# Patient Record
Sex: Female | Born: 1960 | Race: White | Hispanic: No | Marital: Single | Smoking: Never smoker
Health system: Southern US, Community
[De-identification: ages and names within clinical notes are randomized; demographics above are authoritative.]

## PROBLEM LIST (undated history)

## (undated) DIAGNOSIS — F329 Major depressive disorder, single episode, unspecified: Secondary | ICD-10-CM

## (undated) DIAGNOSIS — F32A Depression, unspecified: Secondary | ICD-10-CM

## (undated) DIAGNOSIS — J189 Pneumonia, unspecified organism: Secondary | ICD-10-CM

## (undated) DIAGNOSIS — K219 Gastro-esophageal reflux disease without esophagitis: Secondary | ICD-10-CM

## (undated) HISTORY — PX: MOUTH SURGERY: SHX715

---

## 2003-07-27 ENCOUNTER — Encounter: Admission: RE | Admit: 2003-07-27 | Discharge: 2003-07-27 | Payer: Self-pay | Admitting: Family Medicine

## 2003-11-17 ENCOUNTER — Other Ambulatory Visit: Admission: RE | Admit: 2003-11-17 | Discharge: 2003-11-17 | Payer: Self-pay | Admitting: Family Medicine

## 2004-11-20 ENCOUNTER — Encounter: Admission: RE | Admit: 2004-11-20 | Discharge: 2004-11-20 | Payer: Self-pay | Admitting: Family Medicine

## 2004-12-27 ENCOUNTER — Other Ambulatory Visit: Admission: RE | Admit: 2004-12-27 | Discharge: 2004-12-27 | Payer: Self-pay | Admitting: Family Medicine

## 2005-12-20 ENCOUNTER — Encounter: Admission: RE | Admit: 2005-12-20 | Discharge: 2005-12-20 | Payer: Self-pay | Admitting: Family Medicine

## 2006-10-17 ENCOUNTER — Other Ambulatory Visit: Admission: RE | Admit: 2006-10-17 | Discharge: 2006-10-17 | Payer: Self-pay | Admitting: Family Medicine

## 2006-12-24 ENCOUNTER — Encounter: Admission: RE | Admit: 2006-12-24 | Discharge: 2006-12-24 | Payer: Self-pay | Admitting: Family Medicine

## 2007-12-03 ENCOUNTER — Other Ambulatory Visit: Admission: RE | Admit: 2007-12-03 | Discharge: 2007-12-03 | Payer: Self-pay | Admitting: Family Medicine

## 2007-12-30 ENCOUNTER — Encounter: Admission: RE | Admit: 2007-12-30 | Discharge: 2007-12-30 | Payer: Self-pay | Admitting: Family Medicine

## 2009-01-11 ENCOUNTER — Encounter: Admission: RE | Admit: 2009-01-11 | Discharge: 2009-01-11 | Payer: Self-pay | Admitting: Family Medicine

## 2009-12-22 ENCOUNTER — Encounter
Admission: RE | Admit: 2009-12-22 | Discharge: 2009-12-22 | Payer: Self-pay | Source: Home / Self Care | Admitting: Family Medicine

## 2010-01-12 ENCOUNTER — Encounter
Admission: RE | Admit: 2010-01-12 | Discharge: 2010-01-12 | Payer: Self-pay | Source: Home / Self Care | Attending: Family Medicine | Admitting: Family Medicine

## 2011-01-04 ENCOUNTER — Other Ambulatory Visit: Payer: Self-pay | Admitting: Family Medicine

## 2011-01-04 ENCOUNTER — Ambulatory Visit
Admission: RE | Admit: 2011-01-04 | Discharge: 2011-01-04 | Disposition: A | Discharge status: Home / Self Care | Payer: BC Managed Care – PPO | Source: Ambulatory Visit | Attending: Family Medicine | Admitting: Family Medicine

## 2011-01-04 DIAGNOSIS — R0602 Shortness of breath: Secondary | ICD-10-CM

## 2011-01-04 DIAGNOSIS — R059 Cough, unspecified: Secondary | ICD-10-CM

## 2011-01-04 DIAGNOSIS — R509 Fever, unspecified: Secondary | ICD-10-CM

## 2011-01-04 DIAGNOSIS — R05 Cough: Secondary | ICD-10-CM

## 2011-01-15 ENCOUNTER — Other Ambulatory Visit: Payer: Self-pay | Admitting: Family Medicine

## 2011-01-15 ENCOUNTER — Emergency Department (HOSPITAL_COMMUNITY): Payer: BC Managed Care – PPO

## 2011-01-15 ENCOUNTER — Inpatient Hospital Stay (HOSPITAL_COMMUNITY)
Admission: EM | Admit: 2011-01-15 | Discharge: 2011-01-18 | DRG: 089 | Disposition: A | Discharge status: Home / Self Care | Payer: BC Managed Care – PPO | Attending: Internal Medicine | Admitting: Internal Medicine

## 2011-01-15 DIAGNOSIS — R509 Fever, unspecified: Secondary | ICD-10-CM | POA: Diagnosis present

## 2011-01-15 DIAGNOSIS — R0902 Hypoxemia: Secondary | ICD-10-CM | POA: Diagnosis present

## 2011-01-15 DIAGNOSIS — D72829 Elevated white blood cell count, unspecified: Secondary | ICD-10-CM | POA: Diagnosis present

## 2011-01-15 DIAGNOSIS — F3289 Other specified depressive episodes: Secondary | ICD-10-CM | POA: Diagnosis present

## 2011-01-15 DIAGNOSIS — J189 Pneumonia, unspecified organism: Principal | ICD-10-CM | POA: Diagnosis present

## 2011-01-15 DIAGNOSIS — F329 Major depressive disorder, single episode, unspecified: Secondary | ICD-10-CM | POA: Diagnosis present

## 2011-01-15 DIAGNOSIS — E876 Hypokalemia: Secondary | ICD-10-CM | POA: Diagnosis present

## 2011-01-15 DIAGNOSIS — R197 Diarrhea, unspecified: Secondary | ICD-10-CM | POA: Diagnosis present

## 2011-01-15 HISTORY — DX: Depression, unspecified: F32.A

## 2011-01-15 HISTORY — DX: Major depressive disorder, single episode, unspecified: F32.9

## 2011-01-15 HISTORY — DX: Pneumonia, unspecified organism: J18.9

## 2011-01-15 HISTORY — DX: Gastro-esophageal reflux disease without esophagitis: K21.9

## 2011-01-15 LAB — URINALYSIS, ROUTINE W REFLEX MICROSCOPIC
Bilirubin Urine: NEGATIVE
Glucose, UA: NEGATIVE mg/dL
Hgb urine dipstick: NEGATIVE
Ketones, ur: NEGATIVE mg/dL
Leukocytes, UA: NEGATIVE
Nitrite: NEGATIVE
Specific Gravity, Urine: 1.016 (ref 1.005–1.030)
Urobilinogen, UA: 0.2 mg/dL (ref 0.0–1.0)
pH: 7.5 (ref 5.0–8.0)

## 2011-01-15 LAB — CBC
HCT: 39.2 % (ref 36.0–46.0)
Hemoglobin: 13.4 g/dL (ref 12.0–15.0)
MCH: 28.8 pg (ref 26.0–34.0)
MCHC: 34.2 g/dL (ref 30.0–36.0)
MCV: 84.3 fL (ref 78.0–100.0)
Platelets: 341 10*3/uL (ref 150–400)
RBC: 4.65 MIL/uL (ref 3.87–5.11)
RDW: 13 % (ref 11.5–15.5)

## 2011-01-15 LAB — COMPREHENSIVE METABOLIC PANEL
ALT: 36 U/L — ABNORMAL HIGH (ref 0–35)
AST: 31 U/L (ref 0–37)
Albumin: 3.8 g/dL (ref 3.5–5.2)
BUN: 8 mg/dL (ref 6–23)
CO2: 27 mEq/L (ref 19–32)
Calcium: 9.7 mg/dL (ref 8.4–10.5)
Chloride: 99 mEq/L (ref 96–112)
Creatinine, Ser: 0.6 mg/dL (ref 0.50–1.10)
GFR calc Af Amer: >90 mL/min (ref >90)
GFR calc non Af Amer: >90 mL/min (ref >90)
Glucose, Bld: 82 mg/dL (ref 70–99)
Sodium: 138 mEq/L (ref 135–145)
Total Bilirubin: 0.5 mg/dL (ref 0.3–1.2)
Total Protein: 8 g/dL (ref 6.0–8.3)

## 2011-01-15 LAB — LACTIC ACID, PLASMA: Lactic Acid, Venous: 1.1 mmol/L (ref 0.5–2.2)

## 2011-01-15 MED ORDER — LEVOFLOXACIN IN D5W 750 MG/150ML IV SOLN
750.0000 mg | INTRAVENOUS | Status: DC
  Administered 2011-01-15 – 2011-01-17 (×3): 750 mg via INTRAVENOUS
  Filled 2011-01-15 (×4): qty 150

## 2011-01-15 MED ORDER — IOHEXOL 300 MG/ML  SOLN
100.0000 mL | Freq: Once | INTRAMUSCULAR | Status: AC | PRN
  Administered 2011-01-15: 80 mL via INTRAVENOUS

## 2011-01-15 NOTE — ED Notes (Signed)
Dx Pneumonia by reg dr. Being treated as an outpt.Sent here from Dr. Kevan Ny office for admit.

## 2011-01-15 NOTE — ED Notes (Signed)
Pt refused ABG

## 2011-01-15 NOTE — ED Notes (Signed)
Per EMS- pt reports dx with pneumonia christmas eve- given zpack and rocephin- pt started to improve went to PCP for follow up decreased oxygen sats 88%RA application of 2lnc increased 02 2lnc at present- pt denies SOB and CP at present

## 2011-01-15 NOTE — H&P (Signed)
PCP:   Hollice Espy, MD, MD   Chief Complaint:  Cough  HPI: This is a 50 year old female who states she's been ill since December 15. She saw her PCP on the 18th, her flu swab was negative, she was treated with a Z-Pak. She was given a prescription and told her she did not feel any better she should do chest x-ray in 2 days. She did a chest x-ray was diagnosed with pneumonia, given a shot of IV Rocephin. She had a routine followup today, she told her physician she was still not getting any better, she still had a productive cough brown sputum. She had been febrile up to 103.8, but afebrile in the past few days. She vomited 3 times, no hematemesis. Last episode was Friday. She   she has had ongoing diarrhea, no abdominal pains. No mental staus changes or burning on urination .she reports a sore throat, headache and pleuritic chest pain with her cough.her O2 sat was checked she is 85% in the office.she was sent to the ER.history obtained from patient    Review of Systems:  positives bolded  The patient denies anorexia, fever, weight loss,, vision loss, decreased hearing, hoarseness, chest pain, syncope, dyspnea on exertion, peripheral edema, balance deficits, hemoptysis, abdominal pain, melena, hematochezia, severe indigestion/heartburn, hematuria, incontinence, genital sores, muscle weakness, suspicious skin lesions, transient blindness, difficulty walking, depression, unusual weight change, abnormal bleeding, enlarged lymph nodes, angioedema, and breast masses.  Past Medical History: Past Medical History  Diagnosis Date  . Pneumonia   . Depression   . GERD (gastroesophageal reflux disease)    Past Surgical History  Procedure Date  . Mouth surgery     Medications: Prior to Admission medications   Medication Sig Start Date End Date Taking? Authorizing Provider  ibuprofen (ADVIL,MOTRIN) 200 MG tablet Take 200 mg by mouth every 6 (six) hours as needed.     Yes Historical Provider, MD    sertraline (ZOLOFT) 100 MG tablet Take 100 mg by mouth daily.     Yes Historical Provider, MD    Allergies:   Allergies  Allergen Reactions  . Thimerosal     rash    Social History:  reports that she has never smoked. She has never used smokeless tobacco. She reports that she drinks alcohol. She reports that she does not use illicit drugs.  Family History: Family History  Problem Relation Age of Onset  . Breast cancer      Physical Exam: Filed Vitals:   01/15/11 1522 01/15/11 1531 01/15/11 1534 01/15/11 1957  BP:  117/64  111/59  Pulse:  72  91  Temp:  98.9 F (37.2 C)    TempSrc:  Oral    Resp:  20  22  Weight:  88.451 kg (195 lb)    SpO2: 94% 94% 94% 93%    General:  Alert and oriented times three, well developed and nourished, no acute distress Eyes: PERRLA, pink conjunctiva, no scleral icterus ENT: Moist oral mucosa, neck supple, no thyromegaly Lungs: clear to ascultation, no wheeze, no crackles, no use of accessory muscles Cardiovascular: regular rate and rhythm, no regurgitation, no gallops, no murmurs. No carotid bruits, no JVD Abdomen: soft, positive BS, non-tender, non-distended, no organomegaly, not an acute abdomen GU: not examined Neuro: CN II - XII grossly intact, sensation intact Musculoskeletal: strength 5/5 all extremities, no clubbing, cyanosis or edema Skin: no rash, no subcutaneous crepitation, no decubitus Psych: appropriate patient   Labs on Admission:   [facility]  01/15/11 1618  NA 138  K 3.8  CL 99  CO2 27  GLUCOSE 82  BUN 8  CREATININE 0.60  CALCIUM 9.7  MG --  PHOS --    Basename 01/15/11 1618  AST 31  ALT 36*  ALKPHOS 94  BILITOT 0.5  PROT 8.0  ALBUMIN 3.8   No results found for this basename: LIPASE:2,AMYLASE:2 in the last 72 hours  Basename 01/15/11 1618  WBC 10.7*  NEUTROABS --  HGB 13.4  HCT 39.2  MCV 84.3  PLT 341   No results found for this basename: CKTOTAL:3,CKMB:3,CKMBINDEX:3,TROPONINI:3 in the last 72  hours No results found for this basename: TSH,T4TOTAL,FREET3,T3FREE,THYROIDAB in the last 72 hours No results found for this basename: VITAMINB12:2,FOLATE:2,FERRITIN:2,TIBC:2,IRON:2,RETICCTPCT:2 in the last 72 hours  Radiological Exams on Admission: Dg Chest 2 View  01/15/2011  *RADIOLOGY REPORT*  Clinical Data: 2-week history of cough, shortness of breath, chest pain, and chest congestion.  Follow up pneumonia.  CHEST - 2 VIEW 01/15/2011:  Comparison: Two-view chest x-ray 01/04/2011 [facility].  Findings: Interval progression of patchy airspace opacities in the right middle lobe and right lower lobe.  Right upper lobe and the entire left lung remain clear.  Cardiomediastinal silhouette unremarkable and unchanged.  No pleural effusions.  Visualized bony thorax intact.  IMPRESSION: Right middle lobe and right lower lobe pneumonia, progressive since 01/04/2011.  Original Report Authenticated By: Arnell Sieving, M.D.   Ct Angio Chest W/cm &/or Wo Cm  01/15/2011  *RADIOLOGY REPORT*  Clinical Data:  Shortness of breath, hypoxia  CT ANGIOGRAPHY CHEST WITH CONTRAST  Technique:  Multidetector CT imaging of the chest was performed using the standard protocol during bolus administration of intravenous contrast.  Multiplanar CT image reconstructions including MIPs were obtained to evaluate the vascular anatomy.  Contrast: 80mL OMNIPAQUE IOHEXOL 300 MG/ML IV SOLN  Comparison:  Chest radiographs dated 01/15/2011  Findings:  No evidence of pulmonary embolism.  Numerous nodular/tree-in-bud opacities, predominantly in the right middle and lower lobes.  Trace right pleural effusion.  No pneumothorax.  Visualized thyroid is unremarkable.  The heart is normal in size.  No pericardial effusion.  Right hilar/mediastinal lymphadenopathy, including a 1.4 cm short- axis right hilar node and a 1.3 short-axis subcarinal node.  Visualized upper abdomen is notable for hepatic steatosis.  Mild degenerative changes of the  visualized thoracolumbar spine.  Review of the MIP images confirms the above findings.  IMPRESSION: Numerous right middle/lower lobe tree-in-bud nodular opacities, likely infectious. Associated trace right pleural effusion. Atypical/viral etiologies should also be considered.  Right hilar/mediastinal lymphadenopathy, likely reactive.  Hepatic steatosis.  Original Report Authenticated By: Charline Bills, M.D.    Assessment/Plan Present on Admission:  .Pneumonia .Hypoxia  admit to MedSurg Oxygen, antibiotics, sputum cultures and nebulizers ordered Blood cultures ordered Diarrhea C. difficile toxins Antidiarrheals if negative  Full code  DVT prophylaxis Team 2  Amreen Raczkowski 01/15/2011, 8:34 PM

## 2011-01-15 NOTE — ED Provider Notes (Signed)
History     CSN: [id number]  Arrival date & time 01/15/11  1515   First MD Initiated Contact with Patient 01/15/11 1534      Chief Complaint  Patient presents with  . Shortness of Breath  . Pneumonia  . Fatigue    (Consider location/radiation/quality/duration/timing/severity/associated sxs/prior treatment) Patient is a 50 y.o. female presenting with shortness of breath. The history is provided by the patient.  Shortness of Breath  The current episode started more than 1 week ago. The onset was gradual. The problem occurs continuously. The problem has been gradually worsening. The problem is moderate. The symptoms are relieved by nothing. The symptoms are aggravated by activity. Associated symptoms include a fever and cough. Pertinent negatives include no chest pain, no chest pressure, no orthopnea, no sore throat and no shortness of breath. She has had no prior intubations. Her past medical history does not include asthma. Urine output has been normal. Recently, medical care has been given by the PCP. Services received include medications given and tests performed.   the patient was seen by her primary Dr. on December 18 and was diagnosed with pneumonia. She was given a shot of Rocephin and a Z-Pak. Her pneumonia was confirmed on a chest x-ray. She went to her primary Dr. today for followup she has been feeling gradually worse over the last 3 days with fatigue, cough, chills. Her oxygen saturation was found to be 88% on room air and she was sent to the emergency department for further evaluation.  Past Medical History  Diagnosis Date  . Pneumonia   . Depression   . GERD (gastroesophageal reflux disease)     History reviewed. No pertinent past surgical history.  No family history on file.  History  Substance Use Topics  . Smoking status: Never Smoker   . Smokeless tobacco: Not on file  . Alcohol Use: Yes     Review of Systems  Constitutional: Positive for fever, chills and  fatigue.  HENT: Positive for congestion. Negative for ear pain, sore throat, neck pain and neck stiffness.   Eyes: Negative for pain and visual disturbance.  Respiratory: Positive for cough. Negative for chest tightness and shortness of breath.   Cardiovascular: Negative for chest pain, orthopnea and leg swelling.  Gastrointestinal: Positive for nausea. Negative for abdominal pain and abdominal distention.       2 episodes of vomiting without hematemesis in last week, most recently Friday. Loose stools since initiation of abx therapy  Genitourinary: Negative for dysuria, hematuria and flank pain.  Musculoskeletal: Negative for joint swelling and gait problem.       Aching pain to right mid-back without injury, worse with twisting or coughing  Skin: Negative for rash and wound.  Neurological: Negative for dizziness, syncope, weakness and headaches.  Hematological: Does not bruise/bleed easily.  Psychiatric/Behavioral: Negative for confusion.    Allergies  Thimerosal  Home Medications   Current Outpatient Rx  Name Route Sig Dispense Refill  . IBUPROFEN 200 MG PO TABS Oral Take 200 mg by mouth every 6 (six) hours as needed.      . SERTRALINE HCL 100 MG PO TABS Oral Take 100 mg by mouth daily.        BP 117/64  Pulse 72  Temp(Src) 98.9 F (37.2 C) (Oral)  Resp 20  Wt 195 lb (88.451 kg)  SpO2 94%  Physical Exam  Nursing note and vitals reviewed. Constitutional: She is oriented to person, place, and time. She appears well-developed and well-nourished.  No distress.  HENT:  Head: Normocephalic and atraumatic.  Right Ear: External ear normal.  Left Ear: External ear normal.  Nose: Nose normal.  Mouth/Throat: Oropharynx is clear and moist. No oropharyngeal exudate.  Eyes: Conjunctivae and EOM are normal. Pupils are equal, round, and reactive to light. Right eye exhibits no discharge. Left eye exhibits no discharge.  Neck: Normal range of motion. Neck supple.  Cardiovascular:  Normal rate, regular rhythm, normal heart sounds and intact distal pulses.   No murmur heard. Pulmonary/Chest: Effort normal. Not tachypneic. No respiratory distress. She has decreased breath sounds in the right lower field. She has wheezes in the right upper field. She has rales in the right lower field. She exhibits no tenderness.  Abdominal: Soft. Bowel sounds are normal. She exhibits no distension. There is no tenderness.  Musculoskeletal: She exhibits no edema and no tenderness.  Lymphadenopathy:    She has no cervical adenopathy.  Neurological: She is alert and oriented to person, place, and time. No cranial nerve deficit. Coordination normal.  Skin: Skin is warm and dry. No rash noted. She is not diaphoretic.  Psychiatric: Her behavior is normal.    ED Course  Procedures (including critical care time)  Labs Reviewed  COMPREHENSIVE METABOLIC PANEL - Abnormal; Notable for the following:    ALT 36 (*)    All other components within normal limits  CBC - Abnormal; Notable for the following:    WBC 10.7 (*)    All other components within normal limits  URINALYSIS, ROUTINE W REFLEX MICROSCOPIC  LACTIC ACID, PLASMA  CULTURE, BLOOD (ROUTINE X 2)  CULTURE, BLOOD (ROUTINE X 2)  BLOOD GAS, ARTERIAL   Dg Chest 2 View  01/15/2011  *RADIOLOGY REPORT*  Clinical Data: 2-week history of cough, shortness of breath, chest pain, and chest congestion.  Follow up pneumonia.  CHEST - 2 VIEW 01/15/2011:  Comparison: Two-view chest x-ray 01/04/2011 [facility].  Findings: Interval progression of patchy airspace opacities in the right middle lobe and right lower lobe.  Right upper lobe and the entire left lung remain clear.  Cardiomediastinal silhouette unremarkable and unchanged.  No pleural effusions.  Visualized bony thorax intact.  IMPRESSION: Right middle lobe and right lower lobe pneumonia, progressive since 01/04/2011.  Original Report Authenticated By: Arnell Sieving, M.D.   Ct Angio  Chest W/cm &/or Wo Cm  01/15/2011  *RADIOLOGY REPORT*  Clinical Data:  Shortness of breath, hypoxia  CT ANGIOGRAPHY CHEST WITH CONTRAST  Technique:  Multidetector CT imaging of the chest was performed using the standard protocol during bolus administration of intravenous contrast.  Multiplanar CT image reconstructions including MIPs were obtained to evaluate the vascular anatomy.  Contrast: 80mL OMNIPAQUE IOHEXOL 300 MG/ML IV SOLN  Comparison:  Chest radiographs dated 01/15/2011  Findings:  No evidence of pulmonary embolism.  Numerous nodular/tree-in-bud opacities, predominantly in the right middle and lower lobes.  Trace right pleural effusion.  No pneumothorax.  Visualized thyroid is unremarkable.  The heart is normal in size.  No pericardial effusion.  Right hilar/mediastinal lymphadenopathy, including a 1.4 cm short- axis right hilar node and a 1.3 short-axis subcarinal node.  Visualized upper abdomen is notable for hepatic steatosis.  Mild degenerative changes of the visualized thoracolumbar spine.  Review of the MIP images confirms the above findings.  IMPRESSION: Numerous right middle/lower lobe tree-in-bud nodular opacities, likely infectious. Associated trace right pleural effusion. Atypical/viral etiologies should also be considered.  Right hilar/mediastinal lymphadenopathy, likely reactive.  Hepatic steatosis.  Original Report Authenticated  By: Charline Bills, M.D.    Dx 1: Community acquired pneumonia   MDM  Pneumonia, failed outpatient tx with new hypoxia, other VSS. Pt to be admitted for further management. [facility] consulted, request CT angio chest and ABG for further evaluation. Bed request placed for stepdown. Levaquin ordered.        Shaaron Adler, [place] 01/15/11 [address] [place], [place] 01/15/11 [phone]

## 2011-01-16 LAB — CLOSTRIDIUM DIFFICILE BY PCR: Toxigenic C. Difficile by PCR: NEGATIVE

## 2011-01-16 LAB — CBC
MCH: 28.3 pg (ref 26.0–34.0)
MCV: 85 fL (ref 78.0–100.0)
Platelets: 353 10*3/uL (ref 150–400)
RBC: 4.27 MIL/uL (ref 3.87–5.11)
RDW: 13.1 % (ref 11.5–15.5)
WBC: 8.8 10*3/uL (ref 4.0–10.5)

## 2011-01-16 LAB — BASIC METABOLIC PANEL
CO2: 28 mEq/L (ref 19–32)
Calcium: 9.9 mg/dL (ref 8.4–10.5)
Creatinine, Ser: 0.73 mg/dL (ref 0.50–1.10)
GFR calc non Af Amer: >90 mL/min (ref >90)
Glucose, Bld: 96 mg/dL (ref 70–99)
Sodium: 139 mEq/L (ref 135–145)

## 2011-01-16 MED ORDER — ALUM & MAG HYDROXIDE-SIMETH 200-200-20 MG/5ML PO SUSP: 30.0000 mL | Freq: Four times a day (QID) | ORAL | Status: DC | PRN

## 2011-01-16 MED ORDER — ALBUTEROL SULFATE (5 MG/ML) 0.5% IN NEBU: 2.5000 mg | INHALATION_SOLUTION | RESPIRATORY_TRACT | Status: DC | PRN

## 2011-01-16 MED ORDER — ONDANSETRON HCL 4 MG PO TABS: 4.0000 mg | ORAL_TABLET | Freq: Four times a day (QID) | ORAL | Status: DC | PRN

## 2011-01-16 MED ORDER — SODIUM CHLORIDE 0.9 % IJ SOLN: 3.0000 mL | INTRAMUSCULAR | Status: DC | PRN

## 2011-01-16 MED ORDER — IPRATROPIUM BROMIDE 0.02 % IN SOLN
0.5000 mg | Freq: Three times a day (TID) | RESPIRATORY_TRACT | Status: DC
  Administered 2011-01-16 – 2011-01-18 (×8): 0.5 mg via RESPIRATORY_TRACT
  Filled 2011-01-16 (×9): qty 2.5

## 2011-01-16 MED ORDER — ACETAMINOPHEN 325 MG PO TABS: 650.0000 mg | ORAL_TABLET | Freq: Four times a day (QID) | ORAL | Status: DC | PRN

## 2011-01-16 MED ORDER — ALBUTEROL SULFATE (5 MG/ML) 0.5% IN NEBU
2.5000 mg | INHALATION_SOLUTION | RESPIRATORY_TRACT | Status: DC | PRN
  Administered 2011-01-16: 2.5 mg via RESPIRATORY_TRACT
  Filled 2011-01-16: qty 0.5

## 2011-01-16 MED ORDER — SERTRALINE HCL 100 MG PO TABS
100.0000 mg | ORAL_TABLET | Freq: Every day | ORAL | Status: DC
  Administered 2011-01-16: 50 mg via ORAL
  Administered 2011-01-17: 100 mg via ORAL
  Administered 2011-01-18: 50 mg via ORAL
  Filled 2011-01-16 (×4): qty 1

## 2011-01-16 MED ORDER — IPRATROPIUM BROMIDE 0.02 % IN SOLN: 0.5000 mg | RESPIRATORY_TRACT | Status: DC

## 2011-01-16 MED ORDER — ACETAMINOPHEN 650 MG RE SUPP: 650.0000 mg | Freq: Four times a day (QID) | RECTAL | Status: DC | PRN

## 2011-01-16 MED ORDER — HYDROCODONE-ACETAMINOPHEN 5-325 MG PO TABS: 1.0000 | ORAL_TABLET | ORAL | Status: DC | PRN

## 2011-01-16 MED ORDER — BENZONATATE 100 MG PO CAPS
100.0000 mg | ORAL_CAPSULE | Freq: Three times a day (TID) | ORAL | Status: DC | PRN
  Administered 2011-01-16 – 2011-01-18 (×3): 100 mg via ORAL
  Filled 2011-01-16 (×2): qty 1

## 2011-01-16 MED ORDER — ENOXAPARIN SODIUM 40 MG/0.4ML ~~LOC~~ SOLN
40.0000 mg | SUBCUTANEOUS | Status: DC
  Administered 2011-01-16 – 2011-01-17 (×2): 40 mg via SUBCUTANEOUS
  Filled 2011-01-16 (×4): qty 0.4

## 2011-01-16 MED ORDER — ONDANSETRON HCL 4 MG/2ML IJ SOLN: 4.0000 mg | Freq: Four times a day (QID) | INTRAMUSCULAR | Status: DC | PRN

## 2011-01-16 MED ORDER — IBUPROFEN 200 MG PO TABS
200.0000 mg | ORAL_TABLET | Freq: Four times a day (QID) | ORAL | Status: DC | PRN
  Filled 2011-01-16: qty 1

## 2011-01-16 NOTE — ED Provider Notes (Signed)
Medical screening examination/treatment/procedure(s) were performed by non-physician practitioner and as supervising physician I was immediately available for consultation/collaboration.   Jerrianne Hartin A. Breonia Kirstein, MD 01/16/11 1459 

## 2011-01-16 NOTE — Progress Notes (Signed)
PROGRESS NOTE  Ruthene Methvin ZOX:[id number] DOB: 01-19-1960 DOA: 01/15/2011 PCP: Hollice Espy, MD, MD  Brief narrative: 51 year old woman diagnosed with pneumonia by her primary care physician on Christmas Eve and started on azithromycin and Rocephin. She followed up with her primary care physician December 31 with complaint of feeling worse with fatigue, cough and chills. Oxygen saturation 98% on room air. She was sent to the emergency department for further evaluation. Admitted for pneumonia.  Past medical history: Pneumonia, depression, GERD  Consultants:  None  Procedures:  None  Antibiotics:  December 31: Levaquin  Interim History: Interval documentation reviewed. Subjective: Feels somewhat better today.  Objective: Filed Vitals:   01/16/11 0004 01/16/11 0029 01/16/11 0454 01/16/11 0506  BP: 124/74 108/70  109/74  Pulse:  79  76  Temp:  98.2 F (36.8 C)  98.2 F (36.8 C)  TempSrc:  Oral  Oral  Resp:  18  18  Height:      Weight: 89.5 kg (197 lb 5 oz)     SpO2:  90% 95% 90%    Intake/Output Summary (Last 24 hours) at 01/16/11 1038 Last data filed at 01/16/11 0900  Gross per 24 hour  Intake    360 ml  Output      0 ml  Net    360 ml    Exam:  General: Appears calm and comfortable. Mildly ill. Cardiovascular: Regular rate and rhythm. No murmur, rub or gallop. No lower extremity edema. Respiratory: Decreased breath sounds bilaterally but no frank wheezes, rales or rhonchi. Mild increased work of breathing.  Data Reviewed: Basic Metabolic Panel:  Lab 01/16/11 8119 01/15/11 1618  NA 139 138  K 3.3* 3.8  CL 100 99  CO2 28 27  GLUCOSE 96 82  BUN 8 8  CREATININE 0.73 0.60  CALCIUM 9.9 9.7  MG -- --  PHOS -- --   Liver Function Tests:  Lab 01/15/11 1618  AST 31  ALT 36*  ALKPHOS 94  BILITOT 0.5  PROT 8.0  ALBUMIN 3.8   CBC:  Lab 01/16/11 0515 01/15/11 1618  WBC 8.8 10.7*  NEUTROABS -- --  HGB 12.1 13.4  HCT 36.3 39.2  MCV 85.0  84.3  PLT 353 341   Studies: Dg Chest 2 View  01/15/2011  *RADIOLOGY REPORT*  Clinical Data: 2-week history of cough, shortness of breath, chest pain, and chest congestion.  Follow up pneumonia.  CHEST - 2 VIEW 01/15/2011:  Comparison: Two-view chest x-ray 01/04/2011 [facility].  Findings: Interval progression of patchy airspace opacities in the right middle lobe and right lower lobe.  Right upper lobe and the entire left lung remain clear.  Cardiomediastinal silhouette unremarkable and unchanged.  No pleural effusions.  Visualized bony thorax intact.  IMPRESSION: Right middle lobe and right lower lobe pneumonia, progressive since 01/04/2011.  Original Report Authenticated By: Arnell Sieving, M.D.   Ct Angio Chest W/cm &/or Wo Cm  01/15/2011  *RADIOLOGY REPORT*  Clinical Data:  Shortness of breath, hypoxia  CT ANGIOGRAPHY CHEST WITH CONTRAST  Technique:  Multidetector CT imaging of the chest was performed using the standard protocol during bolus administration of intravenous contrast.  Multiplanar CT image reconstructions including MIPs were obtained to evaluate the vascular anatomy.  Contrast: 80mL OMNIPAQUE IOHEXOL 300 MG/ML IV SOLN  Comparison:  Chest radiographs dated 01/15/2011  Findings:  No evidence of pulmonary embolism.  Numerous nodular/tree-in-bud opacities, predominantly in the right middle and lower lobes.  Trace right pleural effusion.  No pneumothorax.  Visualized thyroid is unremarkable.  The heart is normal in size.  No pericardial effusion.  Right hilar/mediastinal lymphadenopathy, including a 1.4 cm short- axis right hilar node and a 1.3 short-axis subcarinal node.  Visualized upper abdomen is notable for hepatic steatosis.  Mild degenerative changes of the visualized thoracolumbar spine.  Review of the MIP images confirms the above findings.  IMPRESSION: Numerous right middle/lower lobe tree-in-bud nodular opacities, likely infectious. Associated trace right pleural  effusion. Atypical/viral etiologies should also be considered.  Right hilar/mediastinal lymphadenopathy, likely reactive.  Hepatic steatosis.  Original Report Authenticated By: Charline Bills, M.D.    Scheduled Meds:   . enoxaparin  40 mg Subcutaneous Q24H  . ipratropium  0.5 mg Nebulization TID  . levofloxacin (LEVAQUIN) IV  750 mg Intravenous Q24H  . sertraline  100 mg Oral Daily  . DISCONTD: ipratropium  0.5 mg Nebulization Q4H   Continuous Infusions:    Assessment/Plan: 1. Pneumonia: failed outpatient therapy: Some improvement subjectively. Continue Levaquin. 2. Hypokalemia: Replete. 3. Diarrhea: C. difficile negative. History is highly suggestive of adverse reaction to outpatient antibiotics.  Code Status: Full code Family Communication: Spoke with family at bedside Disposition Plan: Home when improved.   Brendia Sacks, MD  [facility] Pager [phone] 01/16/2011, 10:38 AM    LOS: 1 day

## 2011-01-16 NOTE — Progress Notes (Signed)
ANTIBIOTIC CONSULT NOTE - INITIAL  Pharmacy Consult for Levofloxacin Indication: pneumonia  Allergies  Allergen Reactions  . Thimerosal     rash    Patient Measurements: Height: 5\' 6"  (167.6 cm) Weight: 197 lb 5 oz (89.5 kg) IBW/kg (Calculated) : 59.3  Adjusted Body Weight:   Vital Signs: Temp: 98.2 F (36.8 C) (01/01 0029) Temp src: Oral (01/01 0029) BP: 108/70 mmHg (01/01 0029) Pulse Rate: 79  (01/01 0029) Intake/Output from previous day:   Intake/Output from this shift:    Labs:  [facility] 01/15/11 1618  WBC 10.7*  HGB 13.4  PLT 341  LABCREA --  CREATININE 0.60   Estimated Creatinine Clearance: 94.8 ml/min (by C-G formula based on Cr of 0.6). No results found for this basename: VANCOTROUGH:2,VANCOPEAK:2,VANCORANDOM:2,GENTTROUGH:2,GENTPEAK:2,GENTRANDOM:2,TOBRATROUGH:2,TOBRAPEAK:2,TOBRARND:2,AMIKACINPEAK:2,AMIKACINTROU:2,AMIKACIN:2, in the last 72 hours   Microbiology: No results found for this or any previous visit (from the past 720 hour(s)).  Medical History: Past Medical History  Diagnosis Date  . Pneumonia   . Depression   . GERD (gastroesophageal reflux disease)     Medications:  Anti-infectives     Start     Dose/Rate Route Frequency Ordered Stop   01/15/11 1745   Levofloxacin (LEVAQUIN) IVPB 750 mg        750 mg 100 mL/hr over 90 Minutes Intravenous Every 24 hours 01/15/11 1737           Assessment: Patient with PNA and levofloxacin per pharmacy ordered.  Levofloxacin already started at correct dose/frequency.  Goal of Therapy:  Levofloxacin based on renal function   Plan:  Continue with current levofloxacin dose  Darlina Guys, Rennee Coyne [place] 01/16/2011,3:16 AM

## 2011-01-17 MED ORDER — GUAIFENESIN ER 600 MG PO TB12
1200.0000 mg | ORAL_TABLET | Freq: Two times a day (BID) | ORAL | Status: DC
  Administered 2011-01-17 – 2011-01-18 (×3): 1200 mg via ORAL
  Filled 2011-01-17 (×6): qty 2

## 2011-01-17 MED ORDER — POTASSIUM CHLORIDE CRYS ER 20 MEQ PO TBCR
40.0000 meq | EXTENDED_RELEASE_TABLET | Freq: Once | ORAL | Status: AC
  Administered 2011-01-17: 40 meq via ORAL
  Filled 2011-01-17: qty 2

## 2011-01-17 NOTE — Progress Notes (Signed)
UR complete 

## 2011-01-17 NOTE — Progress Notes (Signed)
Effie Wahlert is a 51 y.o. female patient admitted with CAP failing outpatient treatment. I have reviewed her chart,seen and examined her at bedside.  SUBJECTIVE Feels some better but c/o persistent cough.   1. Pneumonia   2. Hypoxia     Past Medical History  Diagnosis Date  . Pneumonia   . Depression   . GERD (gastroesophageal reflux disease)    Current Facility-Administered Medications  Medication Dose Route Frequency Provider Last Rate Last Dose  . acetaminophen (TYLENOL) tablet 650 mg  650 mg Oral Q6H PRN Debby Crosley, MD       Or  . acetaminophen (TYLENOL) suppository 650 mg  650 mg Rectal Q6H PRN Debby Crosley, MD      . ipratropium (ATROVENT) nebulizer solution 0.5 mg  0.5 mg Nebulization TID Reina Fuse Goodrich   0.5 mg at 01/16/11 2157   And  . albuterol (PROVENTIL) (5 MG/ML) 0.5% nebulizer solution 2.5 mg  2.5 mg Nebulization Q4H PRN Pandora Leiter   2.5 mg at 01/16/11 0452  . alum & mag hydroxide-simeth (MAALOX/MYLANTA) 200-200-20 MG/5ML suspension 30 mL  30 mL Oral Q6H PRN Debby Crosley, MD      . benzonatate (TESSALON) capsule 100 mg  100 mg Oral TID PRN Gery Pray, MD   100 mg at 01/16/11 2254  . enoxaparin (LOVENOX) injection 40 mg  40 mg Subcutaneous Q24H Debby Crosley, MD   40 mg at 01/16/11 0955  . guaiFENesin (MUCINEX) 12 hr tablet 1,200 mg  1,200 mg Oral BID Ellajane Stong      . HYDROcodone-acetaminophen (NORCO) 5-325 MG per tablet 1-2 tablet  1-2 tablet Oral Q4H PRN Debby Crosley, MD      . ibuprofen (ADVIL,MOTRIN) tablet 200 mg  200 mg Oral Q6H PRN Debby Crosley, MD      . Levofloxacin (LEVAQUIN) IVPB 750 mg  750 mg Intravenous Q24H Shaaron Adler, PA   750 mg at 01/16/11 1800  . ondansetron (ZOFRAN) tablet 4 mg  4 mg Oral Q6H PRN Debby Crosley, MD       Or  . ondansetron (ZOFRAN) injection 4 mg  4 mg Intravenous Q6H PRN Debby Crosley, MD      . potassium chloride SA (K-DUR,KLOR-CON) CR tablet 40 mEq  40 mEq Oral Once Nazirah Tri        . sertraline (ZOLOFT) tablet 100 mg  100 mg Oral Daily Debby Crosley, MD   50 mg at 01/16/11 0943  . sodium chloride 0.9 % injection 3 mL  3 mL Intravenous PRN Gery Pray, MD       Allergies  Allergen Reactions  . Thimerosal     rash   Principal Problem:  *Hypoxia Active Problems:  Pneumonia   Vital signs in last 24 hours: Temp:  [97.7 F (36.5 C)-98.3 F (36.8 C)] 97.7 F (36.5 C) (01/02 0551) Pulse Rate:  [67-83] 67  (01/02 0551) Resp:  [17-18] 18  (01/02 0551) BP: (103-106)/(60-65) 106/60 mmHg (01/02 0551) SpO2:  [95 %-97 %] 96 % (01/02 0551) Weight change:  Last BM Date: 01/16/11  Intake/Output from previous day: 01/01 0701 - 01/02 0700 In: 1230 [P.O.:1080; IV Piggyback:150] Out: -  Intake/Output this shift:    Lab Results:  Basename 01/16/11 0515 01/15/11 1618  WBC 8.8 10.7*  HGB 12.1 13.4  HCT 36.3 39.2  PLT 353 341   BMET  Basename 01/16/11 0515 01/15/11 1618  NA 139 138  K 3.3* 3.8  CL 100 99  CO2 28 27  GLUCOSE 96 82  BUN 8 8  CREATININE 0.73 0.60  CALCIUM 9.9 9.7    Studies/Results: Dg Chest 2 View  01/15/2011  *RADIOLOGY REPORT*  Clinical Data: 2-week history of cough, shortness of breath, chest pain, and chest congestion.  Follow up pneumonia.  CHEST - 2 VIEW 01/15/2011:  Comparison: Two-view chest x-ray 01/04/2011 [facility].  Findings: Interval progression of patchy airspace opacities in the right middle lobe and right lower lobe.  Right upper lobe and the entire left lung remain clear.  Cardiomediastinal silhouette unremarkable and unchanged.  No pleural effusions.  Visualized bony thorax intact.  IMPRESSION: Right middle lobe and right lower lobe pneumonia, progressive since 01/04/2011.  Original Report Authenticated By: Arnell Sieving, M.D.   Ct Angio Chest W/cm &/or Wo Cm  01/15/2011  *RADIOLOGY REPORT*  Clinical Data:  Shortness of breath, hypoxia  CT ANGIOGRAPHY CHEST WITH CONTRAST  Technique:  Multidetector CT imaging  of the chest was performed using the standard protocol during bolus administration of intravenous contrast.  Multiplanar CT image reconstructions including MIPs were obtained to evaluate the vascular anatomy.  Contrast: 80mL OMNIPAQUE IOHEXOL 300 MG/ML IV SOLN  Comparison:  Chest radiographs dated 01/15/2011  Findings:  No evidence of pulmonary embolism.  Numerous nodular/tree-in-bud opacities, predominantly in the right middle and lower lobes.  Trace right pleural effusion.  No pneumothorax.  Visualized thyroid is unremarkable.  The heart is normal in size.  No pericardial effusion.  Right hilar/mediastinal lymphadenopathy, including a 1.4 cm short- axis right hilar node and a 1.3 short-axis subcarinal node.  Visualized upper abdomen is notable for hepatic steatosis.  Mild degenerative changes of the visualized thoracolumbar spine.  Review of the MIP images confirms the above findings.  IMPRESSION: Numerous right middle/lower lobe tree-in-bud nodular opacities, likely infectious. Associated trace right pleural effusion. Atypical/viral etiologies should also be considered.  Right hilar/mediastinal lymphadenopathy, likely reactive.  Hepatic steatosis.  Original Report Authenticated By: Charline Bills, M.D.    Medications: I have reviewed the patient's current medications.   Physical exam GENERAL- alert HEAD- normal atraumatic, no neck masses, normal thyroid, no jvd RESPIRATORY- occasional cough. Bilateral coarse rhonchi. CVS- regular rate and rhythm, S1, S2 normal, no murmur, click, rub or gallop ABDOMEN- abdomen is soft without significant tenderness, masses, organomegaly or guarding NEURO- Grossly normal EXTREMITIES- extremities normal, atraumatic, no cyanosis or edema  Plan 1. Community acquired pneumonia failing outpatient treatment, likely atypical, severe with acute hypoxia- better, leukocytosis resolved, afebrile- continue levaquin iv, replenish potassium, likely d/c in am. 2. dvt  prophylaxis.    Micalah Cabezas 01/17/2011 8:58 AM Pager: [phone].

## 2011-01-17 NOTE — Progress Notes (Signed)
ANTIBIOTIC CONSULT NOTE - follow-up  Pharmacy Consult for Levofloxacin Indication: pneumonia  Allergies  Allergen Reactions  . Thimerosal     rash    Patient Measurements: Height: 5\' 6"  (167.6 cm) Weight: 197 lb 5 oz (89.5 kg) IBW/kg (Calculated) : 59.3     Vital Signs: Temp: 97.7 F (36.5 C) (01/02 0551) Temp src: Oral (01/02 0551) BP: 106/60 mmHg (01/02 0551) Pulse Rate: 67  (01/02 0551) Intake/Output from previous day: 01/01 0701 - 01/02 0700 In: 1230 [P.O.:1080; IV Piggyback:150] Out: -  Intake/Output from this shift:    Labs:  [facility] 01/16/11 0515 01/15/11 1618  WBC 8.8 10.7*  HGB 12.1 13.4  PLT 353 341  LABCREA -- --  CREATININE 0.73 0.60   Estimated Creatinine Clearance: 94.8 ml/min (by C-G formula based on Cr of 0.73). No results found for this basename: VANCOTROUGH:2,VANCOPEAK:2,VANCORANDOM:2,GENTTROUGH:2,GENTPEAK:2,GENTRANDOM:2,TOBRATROUGH:2,TOBRAPEAK:2,TOBRARND:2,AMIKACINPEAK:2,AMIKACINTROU:2,AMIKACIN:2, in the last 72 hours   Microbiology: Recent Results (from the past 720 hour(s))  CULTURE, BLOOD (ROUTINE X 2)     Status: Normal (Preliminary result)   Collection Time   01/15/11  6:00 PM      Component Value Range Status Comment   Specimen Description BLOOD RIGHT AC   Final    Special Requests BOTTLES DRAWN AEROBIC AND ANAEROBIC 3 CC EACH   Final    Setup Time [id number]   Final    Culture     Final    Value:        BLOOD CULTURE RECEIVED NO GROWTH TO DATE CULTURE WILL BE HELD FOR 5 DAYS BEFORE ISSUING A FINAL NEGATIVE REPORT   Report Status PENDING   Incomplete   CULTURE, BLOOD (ROUTINE X 2)     Status: Normal (Preliminary result)   Collection Time   01/15/11  6:03 PM      Component Value Range Status Comment   Specimen Description BLOOD RIGHT HAND   Final    Special Requests BOTTLES DRAWN AEROBIC ONLY 3 CC   Final    Setup Time [id number]   Final    Culture     Final    Value:        BLOOD CULTURE RECEIVED NO GROWTH TO DATE CULTURE  WILL BE HELD FOR 5 DAYS BEFORE ISSUING A FINAL NEGATIVE REPORT   Report Status PENDING   Incomplete   CLOSTRIDIUM DIFFICILE BY PCR     Status: Normal   Collection Time   01/16/11 10:25 AM      Component Value Range Status Comment   C difficile by pcr NEGATIVE  NEGATIVE  Final     Medical History: Past Medical History  Diagnosis Date  . Pneumonia   . Depression   . GERD (gastroesophageal reflux disease)     Medications:  Anti-infectives     Start     Dose/Rate Route Frequency Ordered Stop   01/15/11 1745   Levofloxacin (LEVAQUIN) IVPB 750 mg        750 mg 100 mL/hr over 90 Minutes Intravenous Every 24 hours 01/15/11 1737           Assessment: Day #2 Levaquin for CAP. Stable SCr, improvement in WBC, afebrile  Goal of Therapy:  Levofloxacin based on renal function   Plan:  Continue with current levofloxacin dose. Do not anticipate change in dose will be necessary. Will sign off  Stacy Zimmerman 01/17/2011,11:15 AM

## 2011-01-18 LAB — COMPREHENSIVE METABOLIC PANEL
ALT: 27 U/L (ref 0–35)
AST: 23 U/L (ref 0–37)
Alkaline Phosphatase: 83 U/L (ref 39–117)
CO2: 23 mEq/L (ref 19–32)
GFR calc Af Amer: >90 mL/min (ref >90)
GFR calc non Af Amer: >90 mL/min (ref >90)
Glucose, Bld: 102 mg/dL — ABNORMAL HIGH (ref 70–99)
Potassium: 4.1 mEq/L (ref 3.5–5.1)
Sodium: 138 mEq/L (ref 135–145)

## 2011-01-18 LAB — CBC
Hemoglobin: 12.7 g/dL (ref 12.0–15.0)
Platelets: 393 10*3/uL (ref 150–400)
RBC: 4.54 MIL/uL (ref 3.87–5.11)
WBC: 8.9 10*3/uL (ref 4.0–10.5)

## 2011-01-18 MED ORDER — LEVOFLOXACIN 750 MG PO TABS: 750.0000 mg | ORAL_TABLET | Freq: Every day | ORAL | Status: AC

## 2011-01-18 MED ORDER — LEVALBUTEROL TARTRATE 45 MCG/ACT IN AERO: 1.0000 | INHALATION_SPRAY | RESPIRATORY_TRACT | Status: AC | PRN

## 2011-01-18 MED ORDER — GUAIFENESIN ER 600 MG PO TB12: 1200.0000 mg | ORAL_TABLET | Freq: Two times a day (BID) | ORAL | Status: AC

## 2011-01-18 NOTE — Discharge Summary (Signed)
DISCHARGE SUMMARY  Stacy Zimmerman  MR#: [id number]  DOB:1960/08/04  Date of Admission: 01/15/2011 Date of Discharge: 01/18/2011  Attending Physician:Mercer Stallworth  Patient's JYN:WGNFA,OZHYQ RUTH, MD, MD  Consults: none.  Discharge Diagnoses: Present on Admission:  .Pneumonia .Hypoxia    Current Discharge Medication List    START taking these medications   Details  guaiFENesin (MUCINEX) 600 MG 12 hr tablet Take 2 tablets (1,200 mg total) by mouth 2 (two) times daily. Qty: 10 tablet, Refills: 0    levalbuterol (XOPENEX HFA) 45 MCG/ACT inhaler Inhale 1-2 puffs into the lungs every 4 (four) hours as needed for wheezing. Qty: 1 Inhaler, Refills: 12    levofloxacin (LEVAQUIN) 750 MG tablet Take 1 tablet (750 mg total) by mouth daily. Qty: 6 tablet, Refills: 0      CONTINUE these medications which have NOT CHANGED   Details  ibuprofen (ADVIL,MOTRIN) 200 MG tablet Take 200 mg by mouth every 6 (six) hours as needed.     loratadine (CLARITIN) 10 MG tablet Take 10 mg by mouth daily.      Multiple Vitamin (MULITIVITAMIN WITH MINERALS) TABS Take 1 tablet by mouth daily.      omega-3 acid ethyl esters (LOVAZA) 1 G capsule Take 2 g by mouth 2 (two) times daily.      sertraline (ZOLOFT) 100 MG tablet Take 50 mg by mouth daily.      Vitamin D, Ergocalciferol, (DRISDOL) 50000 UNITS CAPS Take 50,000 Units by mouth 3 (three) times a week.            Hospital Course: Ms Stacy Zimmerman is a pleasant 51 year old female admitted on 01/15/11 with worsening cough, not responding to outpatient treatment. Patient had CT chest which showed PNA, suggesting an atypical type. She was placed on levaquin and has slowly improved. Her cough is less marked. She is afebrile and will d/c today on levaquin for a few more days. She is discharged in stable condition.   Day of Discharge BP 114/62  Pulse 66  Temp(Src) 97.9 F (36.6 C) (Oral)  Resp 21  Ht 5\' 6"  (1.676 m)  Wt 89.5 kg (197 lb 5 oz)   BMI 31.85 kg/m2  SpO2 91%  Physical Exam: Scant cough, not in distress.  Results for orders placed during the hospital encounter of 01/15/11 (from the past 24 hour(s))  CBC     Status: Normal   Collection Time   01/18/11  5:05 AM      Component Value Range   WBC 8.9  4.0 - 10.5 (K/uL)   RBC 4.54  3.87 - 5.11 (MIL/uL)   Hemoglobin 12.7  12.0 - 15.0 (g/dL)   HCT 65.7  84.6 - 96.2 (%)   MCV 83.9  78.0 - 100.0 (fL)   MCH 28.0  26.0 - 34.0 (pg)   MCHC 33.3  30.0 - 36.0 (g/dL)   RDW 95.2  84.1 - 32.4 (%)   Platelets 393  150 - 400 (K/uL)  COMPREHENSIVE METABOLIC PANEL     Status: Abnormal   Collection Time   01/18/11  5:05 AM      Component Value Range   Sodium 138  135 - 145 (mEq/L)   Potassium 4.1  3.5 - 5.1 (mEq/L)   Chloride 101  96 - 112 (mEq/L)   CO2 23  19 - 32 (mEq/L)   Glucose, Bld 102 (*) 70 - 99 (mg/dL)   BUN 13  6 - 23 (mg/dL)   Creatinine, Ser 4.01  0.50 - 1.10 (mg/dL)  Calcium 10.1  8.4 - 10.5 (mg/dL)   Total Protein 7.7  6.0 - 8.3 (g/dL)   Albumin 3.6  3.5 - 5.2 (g/dL)   AST 23  0 - 37 (U/L)   ALT 27  0 - 35 (U/L)   Alkaline Phosphatase 83  39 - 117 (U/L)   Total Bilirubin 0.4  0.3 - 1.2 (mg/dL)   GFR calc non Af Amer >90  >90 (mL/min)   GFR calc Af Amer >90  >90 (mL/min)  MAGNESIUM     Status: Normal   Collection Time   01/18/11  5:05 AM      Component Value Range   Magnesium 2.0  1.5 - 2.5 (mg/dL)  PHOSPHORUS     Status: Abnormal   Collection Time   01/18/11  5:05 AM      Component Value Range   Phosphorus 4.9 (*) 2.3 - 4.6 (mg/dL)    Disposition: home to follow with Dr Kevan Ny in 1 week.      Time spent in discharge (includes decision making & examination of pt): 20 minutes.  Signed: Cay Kath 01/18/2011, 2:44 PM

## 2011-01-22 LAB — CULTURE, BLOOD (ROUTINE X 2)
Culture  Setup Time: 201301010210
Culture: NO GROWTH

## 2011-02-08 ENCOUNTER — Other Ambulatory Visit: Payer: Self-pay | Admitting: Family Medicine

## 2011-02-08 ENCOUNTER — Ambulatory Visit
Admission: RE | Admit: 2011-02-08 | Discharge: 2011-02-08 | Disposition: A | Discharge status: Home / Self Care | Payer: BC Managed Care – PPO | Source: Ambulatory Visit | Attending: Family Medicine | Admitting: Family Medicine

## 2011-02-08 DIAGNOSIS — J189 Pneumonia, unspecified organism: Secondary | ICD-10-CM

## 2011-04-06 ENCOUNTER — Other Ambulatory Visit: Payer: Self-pay | Admitting: Family Medicine

## 2011-04-06 DIAGNOSIS — Z1231 Encounter for screening mammogram for malignant neoplasm of breast: Secondary | ICD-10-CM

## 2011-04-19 ENCOUNTER — Ambulatory Visit
Admission: RE | Admit: 2011-04-19 | Discharge: 2011-04-19 | Disposition: A | Discharge status: Home / Self Care | Payer: BC Managed Care – PPO | Source: Ambulatory Visit | Attending: Family Medicine | Admitting: Family Medicine

## 2011-04-19 DIAGNOSIS — Z1231 Encounter for screening mammogram for malignant neoplasm of breast: Secondary | ICD-10-CM

## 2012-05-09 ENCOUNTER — Other Ambulatory Visit: Payer: Self-pay | Admitting: Family Medicine

## 2012-05-09 DIAGNOSIS — Z1231 Encounter for screening mammogram for malignant neoplasm of breast: Secondary | ICD-10-CM

## 2012-05-28 ENCOUNTER — Ambulatory Visit
Admission: RE | Admit: 2012-05-28 | Discharge: 2012-05-28 | Disposition: A | Discharge status: Home / Self Care | Payer: PRIVATE HEALTH INSURANCE | Source: Ambulatory Visit | Attending: Family Medicine | Admitting: Family Medicine

## 2012-05-28 DIAGNOSIS — Z1231 Encounter for screening mammogram for malignant neoplasm of breast: Secondary | ICD-10-CM

## 2012-12-27 IMAGING — CR DG CHEST 2V
2 series · 2 of 2 positions shown · non-contrast
Comparison: Two-view chest x-ray 01/04/2011 [HOSPITAL[facility]

CLINICAL DATA: 2-week history of cough, shortness of breath, chest
pain, and chest congestion.  Follow up pneumonia.

CHEST - 2 VIEW 01/15/2011:

[w chest pa]
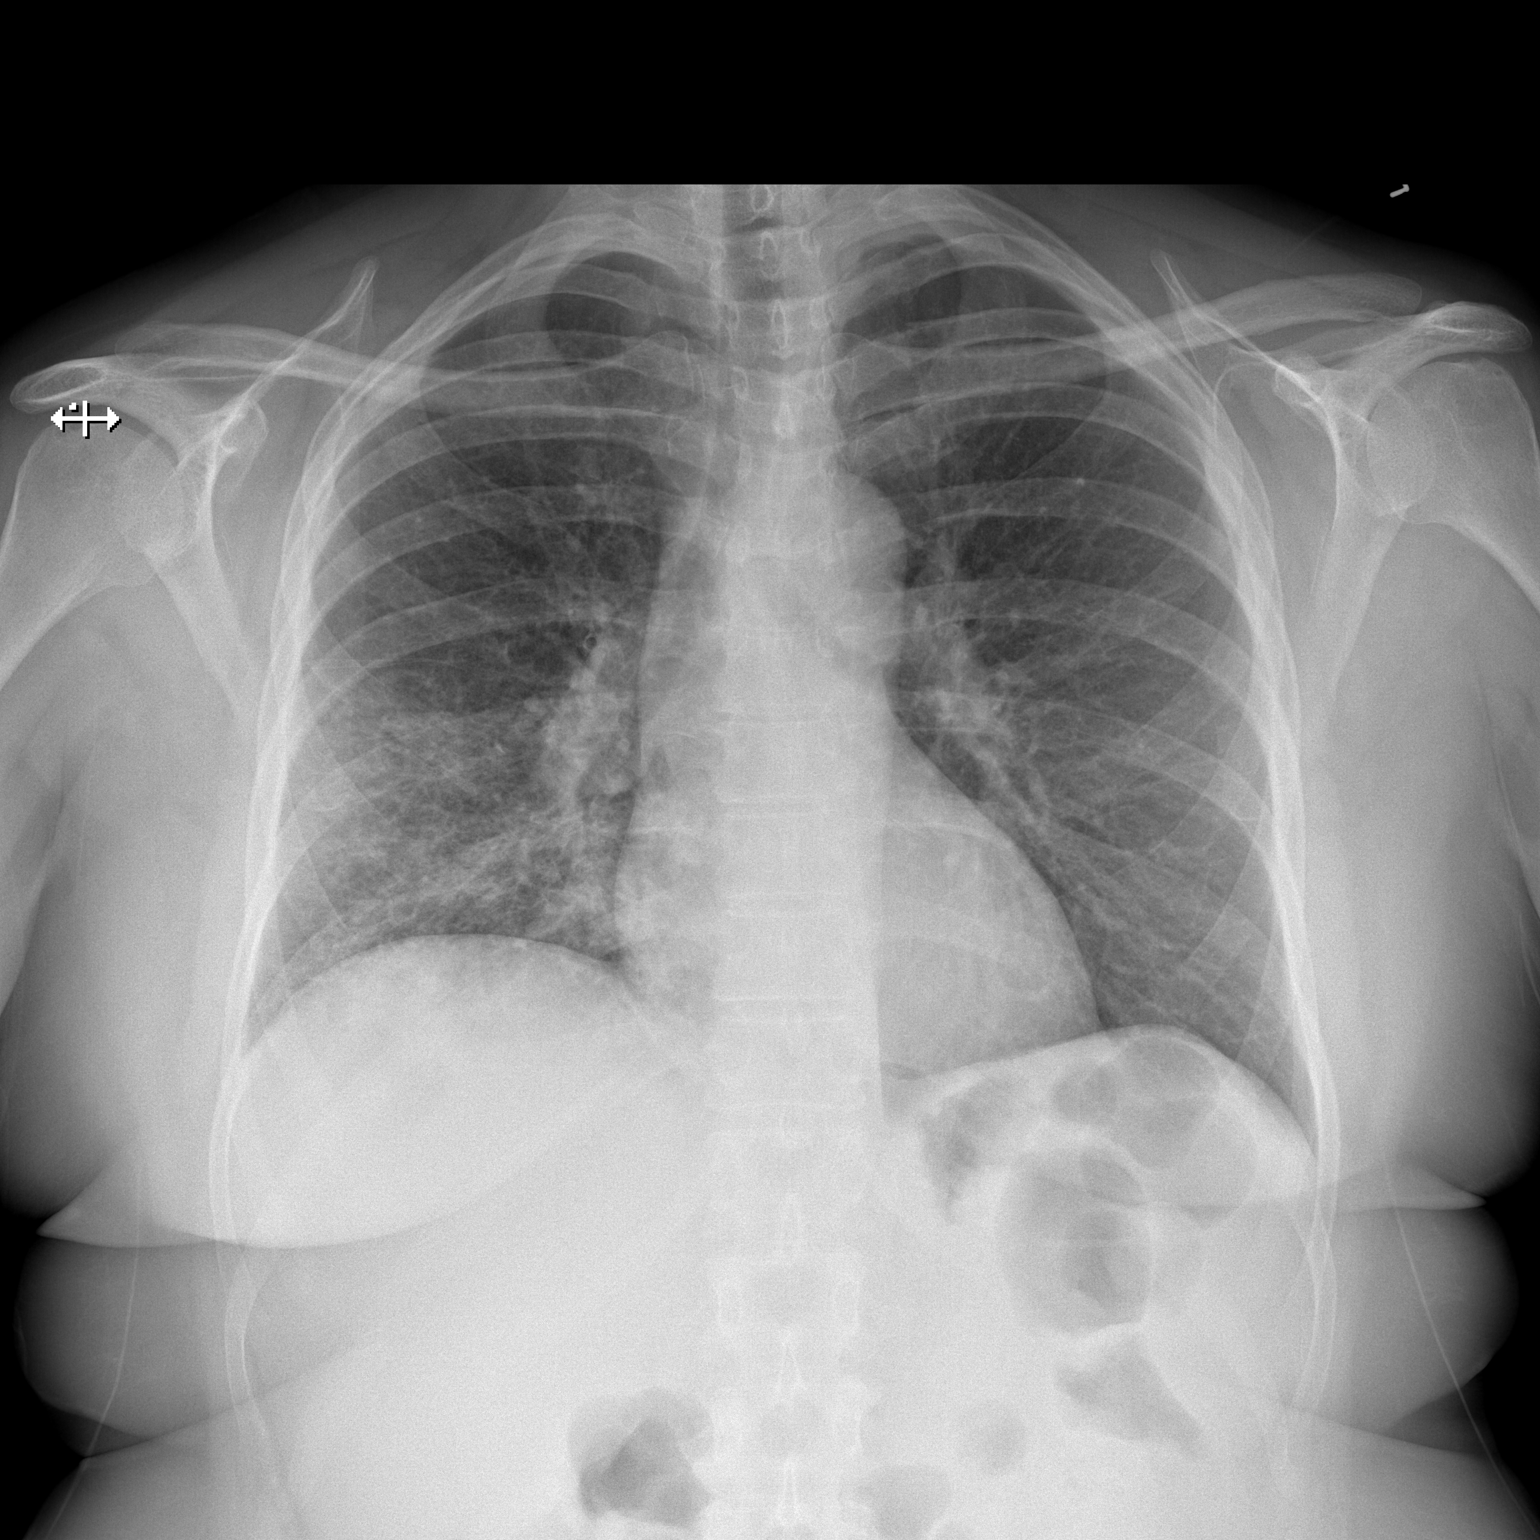

[w chest lat]
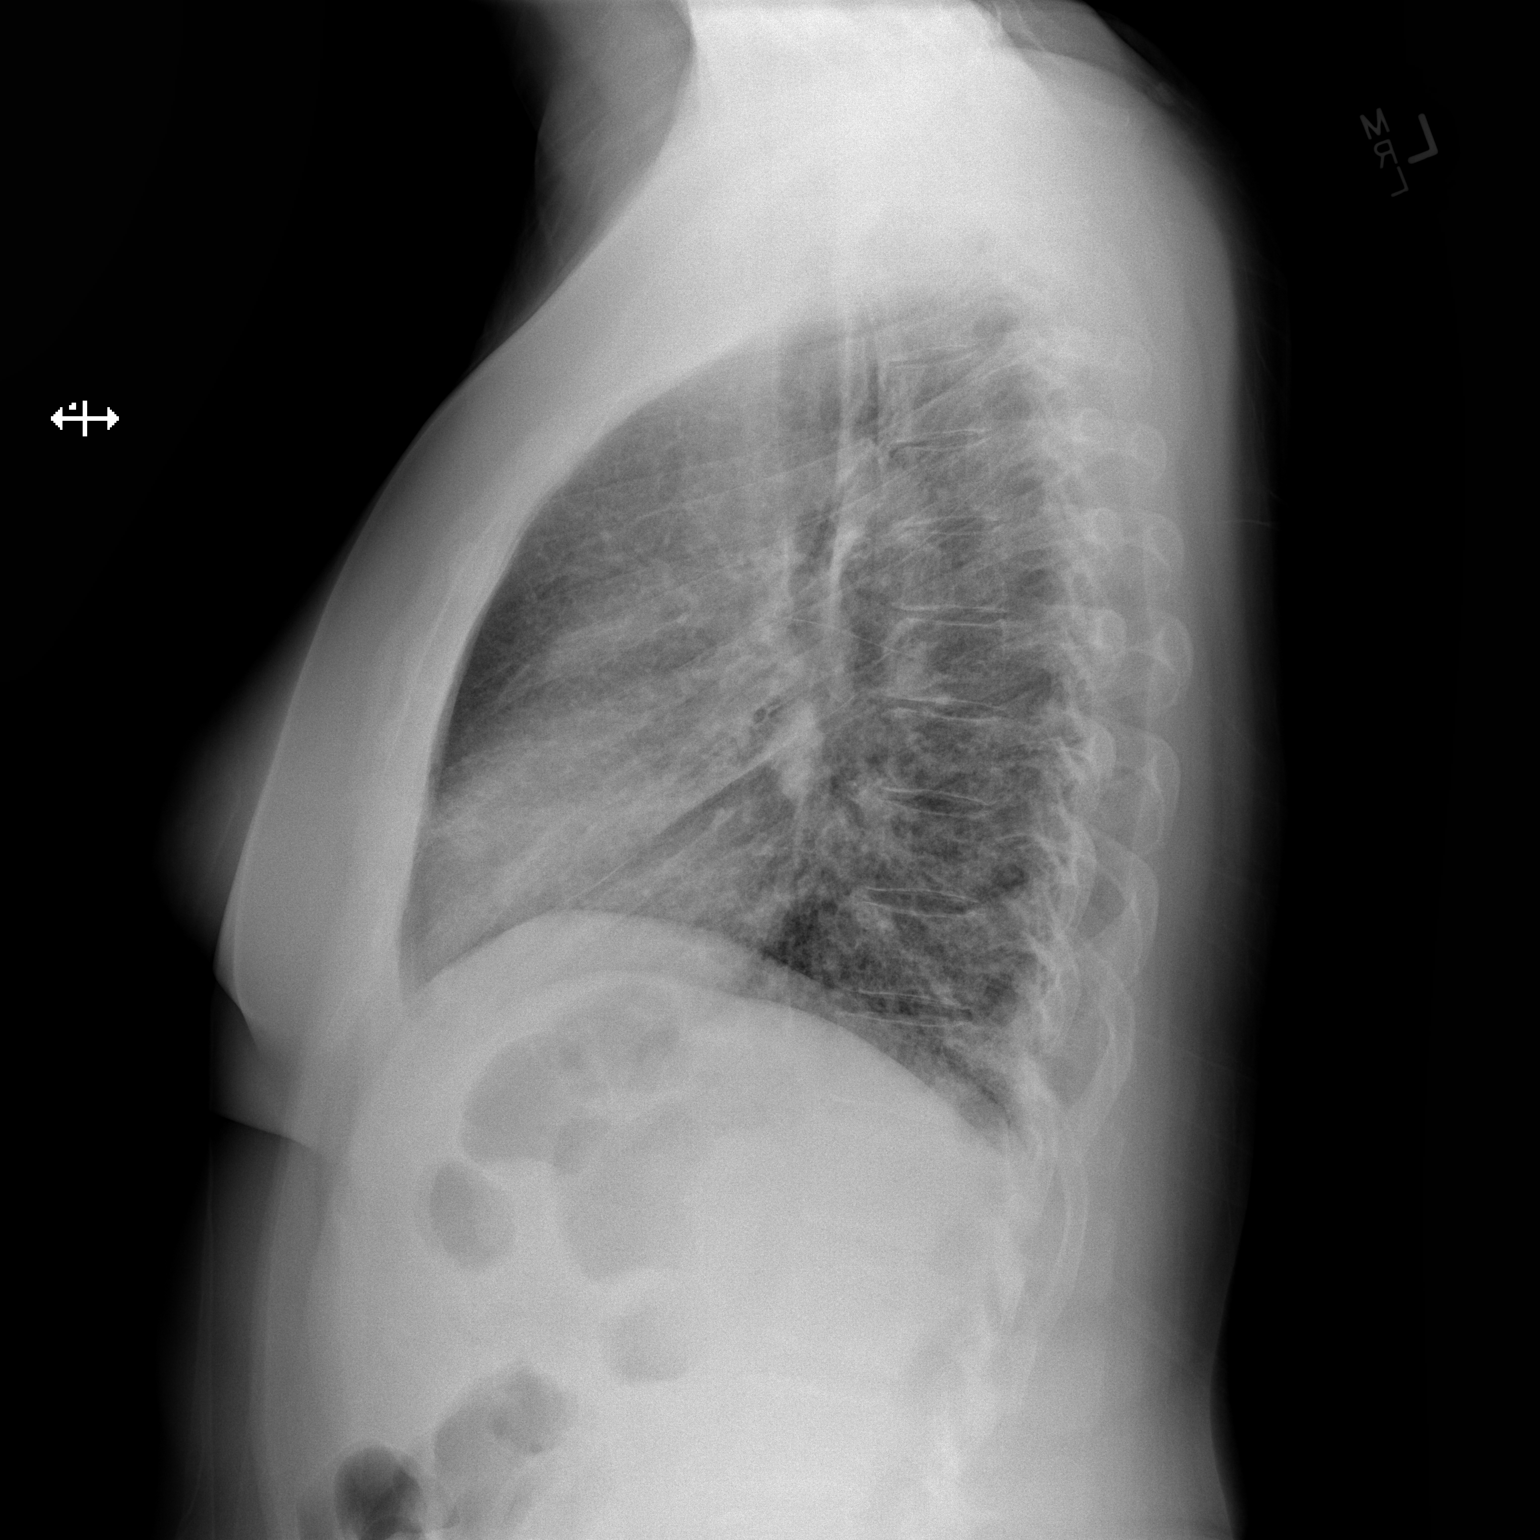

[2 of 2 positions shown; findings below may reference images not displayed]

FINDINGS: Interval progression of patchy airspace opacities in the
right middle lobe and right lower lobe.  Right upper lobe and the
entire left lung remain clear.  Cardiomediastinal silhouette
unremarkable and unchanged.  No pleural effusions.  Visualized bony
thorax intact.
IMPRESSION: Right middle lobe and right lower lobe pneumonia, progressive since
01/04/2011.

## 2013-04-29 ENCOUNTER — Other Ambulatory Visit: Payer: Self-pay

## 2013-04-29 DIAGNOSIS — Z1231 Encounter for screening mammogram for malignant neoplasm of breast: Secondary | ICD-10-CM

## 2013-05-28 ENCOUNTER — Other Ambulatory Visit: Payer: Self-pay

## 2013-05-28 DIAGNOSIS — Z1231 Encounter for screening mammogram for malignant neoplasm of breast: Secondary | ICD-10-CM

## 2013-05-28 DIAGNOSIS — Z803 Family history of malignant neoplasm of breast: Secondary | ICD-10-CM

## 2013-06-12 ENCOUNTER — Ambulatory Visit
Admission: RE | Admit: 2013-06-12 | Discharge: 2013-06-12 | Disposition: A | Discharge status: Home / Self Care | Payer: PRIVATE HEALTH INSURANCE | Source: Ambulatory Visit

## 2013-06-12 ENCOUNTER — Ambulatory Visit: Payer: PRIVATE HEALTH INSURANCE

## 2013-06-12 DIAGNOSIS — Z803 Family history of malignant neoplasm of breast: Secondary | ICD-10-CM

## 2013-06-12 DIAGNOSIS — Z1231 Encounter for screening mammogram for malignant neoplasm of breast: Secondary | ICD-10-CM

## 2014-01-13 ENCOUNTER — Other Ambulatory Visit (HOSPITAL_COMMUNITY)
Admission: RE | Admit: 2014-01-13 | Discharge: 2014-01-13 | Disposition: A | Discharge status: Home / Self Care | Payer: PRIVATE HEALTH INSURANCE | Source: Ambulatory Visit | Attending: Family Medicine | Admitting: Family Medicine

## 2014-01-13 ENCOUNTER — Other Ambulatory Visit: Payer: Self-pay | Admitting: Family Medicine

## 2014-01-13 DIAGNOSIS — Z124 Encounter for screening for malignant neoplasm of cervix: Secondary | ICD-10-CM | POA: Insufficient documentation

## 2014-01-18 LAB — CYTOLOGY - PAP

## 2014-05-27 ENCOUNTER — Other Ambulatory Visit: Payer: Self-pay

## 2014-05-27 DIAGNOSIS — Z1231 Encounter for screening mammogram for malignant neoplasm of breast: Secondary | ICD-10-CM

## 2014-06-18 ENCOUNTER — Ambulatory Visit
Admission: RE | Admit: 2014-06-18 | Discharge: 2014-06-18 | Disposition: A | Discharge status: Home / Self Care | Payer: PRIVATE HEALTH INSURANCE | Source: Ambulatory Visit

## 2014-06-18 DIAGNOSIS — Z1231 Encounter for screening mammogram for malignant neoplasm of breast: Secondary | ICD-10-CM

## 2015-07-06 ENCOUNTER — Other Ambulatory Visit: Payer: Self-pay | Admitting: Family Medicine

## 2015-07-06 DIAGNOSIS — Z1231 Encounter for screening mammogram for malignant neoplasm of breast: Secondary | ICD-10-CM

## 2015-07-25 ENCOUNTER — Ambulatory Visit: Payer: PRIVATE HEALTH INSURANCE

## 2015-07-25 ENCOUNTER — Ambulatory Visit
Admission: RE | Admit: 2015-07-25 | Discharge: 2015-07-25 | Disposition: A | Discharge status: Home / Self Care | Payer: PRIVATE HEALTH INSURANCE | Source: Ambulatory Visit | Attending: Family Medicine | Admitting: Family Medicine

## 2015-07-25 DIAGNOSIS — Z1231 Encounter for screening mammogram for malignant neoplasm of breast: Secondary | ICD-10-CM

## 2016-06-26 ENCOUNTER — Other Ambulatory Visit: Payer: Self-pay | Admitting: Family Medicine

## 2016-06-26 DIAGNOSIS — Z1231 Encounter for screening mammogram for malignant neoplasm of breast: Secondary | ICD-10-CM

## 2016-07-26 ENCOUNTER — Ambulatory Visit
Admission: RE | Admit: 2016-07-26 | Discharge: 2016-07-26 | Disposition: A | Discharge status: Home / Self Care | Payer: PRIVATE HEALTH INSURANCE | Source: Ambulatory Visit | Attending: Family Medicine | Admitting: Family Medicine

## 2016-07-26 DIAGNOSIS — Z1231 Encounter for screening mammogram for malignant neoplasm of breast: Secondary | ICD-10-CM

## 2017-03-20 ENCOUNTER — Other Ambulatory Visit (HOSPITAL_COMMUNITY)
Admission: RE | Admit: 2017-03-20 | Discharge: 2017-03-20 | Disposition: A | Discharge status: Home / Self Care | Payer: PRIVATE HEALTH INSURANCE | Source: Ambulatory Visit | Attending: Family Medicine | Admitting: Family Medicine

## 2017-03-20 ENCOUNTER — Other Ambulatory Visit: Payer: Self-pay | Admitting: Family Medicine

## 2017-03-20 DIAGNOSIS — Z01411 Encounter for gynecological examination (general) (routine) with abnormal findings: Secondary | ICD-10-CM | POA: Diagnosis not present

## 2017-03-21 LAB — CYTOLOGY - PAP: DIAGNOSIS: NEGATIVE

## 2017-07-03 ENCOUNTER — Other Ambulatory Visit: Payer: Self-pay | Admitting: Family Medicine

## 2017-07-03 DIAGNOSIS — Z1231 Encounter for screening mammogram for malignant neoplasm of breast: Secondary | ICD-10-CM

## 2017-07-31 ENCOUNTER — Ambulatory Visit: Payer: PRIVATE HEALTH INSURANCE

## 2017-08-01 ENCOUNTER — Ambulatory Visit
Admission: RE | Admit: 2017-08-01 | Discharge: 2017-08-01 | Disposition: A | Discharge status: Home / Self Care | Payer: PRIVATE HEALTH INSURANCE | Source: Ambulatory Visit | Attending: Family Medicine | Admitting: Family Medicine

## 2017-08-01 DIAGNOSIS — Z1231 Encounter for screening mammogram for malignant neoplasm of breast: Secondary | ICD-10-CM

## 2018-09-04 ENCOUNTER — Other Ambulatory Visit: Payer: Self-pay | Admitting: Family Medicine

## 2018-09-04 DIAGNOSIS — Z1231 Encounter for screening mammogram for malignant neoplasm of breast: Secondary | ICD-10-CM

## 2018-10-22 ENCOUNTER — Ambulatory Visit
Admission: RE | Admit: 2018-10-22 | Discharge: 2018-10-22 | Disposition: A | Discharge status: Home / Self Care | Payer: PRIVATE HEALTH INSURANCE | Source: Ambulatory Visit | Attending: Family Medicine | Admitting: Family Medicine

## 2018-10-22 ENCOUNTER — Other Ambulatory Visit: Payer: Self-pay

## 2018-10-22 DIAGNOSIS — Z1231 Encounter for screening mammogram for malignant neoplasm of breast: Secondary | ICD-10-CM

## 2019-03-11 ENCOUNTER — Emergency Department (HOSPITAL_COMMUNITY)
Admission: EM | Admit: 2019-03-11 | Discharge: 2019-03-11 | Disposition: A | Discharge status: Home / Self Care | Payer: PRIVATE HEALTH INSURANCE | Attending: Emergency Medicine | Admitting: Emergency Medicine

## 2019-03-11 ENCOUNTER — Emergency Department (HOSPITAL_COMMUNITY): Payer: PRIVATE HEALTH INSURANCE

## 2019-03-11 DIAGNOSIS — Z79899 Other long term (current) drug therapy: Secondary | ICD-10-CM | POA: Diagnosis not present

## 2019-03-11 DIAGNOSIS — R55 Syncope and collapse: Secondary | ICD-10-CM | POA: Diagnosis not present

## 2019-03-11 NOTE — ED Triage Notes (Signed)
BIB EMS from blood donation center. Gave blood about 1830. PT had syncope episode when ambulating to get snack after donating blood. Hit back of head pt not blood thinner abrassion noted. No headache, dizziness or vomiting with EMS.    Postive orthostatics with EMS  NS RAC  Now 120/72  98% RA 64  cbg 112

## 2019-03-11 NOTE — ED Notes (Signed)
PT ambulated to restroom without assistance. Pt denies dizziness, or difficulty ambulating.

## 2019-03-11 NOTE — ED Provider Notes (Addendum)
[facility]-EMERGENCY DEPT Provider Note   CSN: [id number] Arrival date & time: 03/11/19  2011     History Chief Complaint  Patient presents with  . Loss of Consciousness    Stacy Zimmerman is a 59 y.o. female with a pertinent PMH of GERD, who presented with syncope after donating blood. Patient remembers feeling lightheaded and warm sensation just after giving blood and woke up on the ground. She fell backwards and hit her head. Patient was given IV fluids in route via EMS. Patient states that she had a headache but otherwise denies any other symptoms.    HPI     Past Medical History:  Diagnosis Date  . Depression   . GERD (gastroesophageal reflux disease)   . Pneumonia     Patient Active Problem List   Diagnosis Date Noted  . Pneumonia 01/15/2011    Past Surgical History:  Procedure Laterality Date  . MOUTH SURGERY       OB History   No obstetric history on file.     Family History  Problem Relation Age of Onset  . Breast cancer Other   . Breast cancer Sister   . Breast cancer Maternal Aunt   . Breast cancer Paternal Aunt     Social History   Tobacco Use  . Smoking status: Never Smoker  . Smokeless tobacco: Never Used  Substance Use Topics  . Alcohol use: Yes  . Drug use: No    Home Medications Prior to Admission medications   Medication Sig Start Date End Date Taking? Authorizing Provider  Cholecalciferol (VITAMIN D) 50 MCG (2000 UT) tablet Take 4,000 Units by mouth daily.   Yes [provider]  loratadine (CLARITIN) 10 MG tablet Take 10 mg by mouth daily.     Yes [provider]  Multiple Vitamin (MULITIVITAMIN WITH MINERALS) TABS Take 1 tablet by mouth daily.     Yes [provider]  levalbuterol (XOPENEX HFA) 45 MCG/ACT inhaler Inhale 1-2 puffs into the lungs every 4 (four) hours as needed for wheezing. 01/18/11 01/18/12  Conley Canal, MD    Allergies    Thimerosal  Review of Systems    Review of Systems - All review of systems are negative except what is noted on the HPI.   Physical Exam Updated Vital Signs BP 109/65   Pulse 61   Temp 98 F (36.7 C)   Resp 16   Ht 5\' 6"  (1.676 m)   Wt 74.8 kg   SpO2 100%   BMI 26.63 kg/m   Physical Exam Constitutional:      Appearance: Normal appearance.  HENT:     Head: Normocephalic and atraumatic.  Eyes:     Extraocular Movements: Extraocular movements intact.  Cardiovascular:     Rate and Rhythm: Normal rate and regular rhythm.     Pulses: Normal pulses.     Heart sounds: Normal heart sounds.  Pulmonary:     Effort: Pulmonary effort is normal. No respiratory distress.  Chest:     Chest wall: No tenderness.  Abdominal:     General: There is no distension.     Tenderness: There is no abdominal tenderness.  Musculoskeletal:        General: No swelling. Normal range of motion.     Cervical back: Normal range of motion. No rigidity.  Skin:    General: Skin is warm and dry.     Findings: Lesion (on the back right scalp) present.  Neurological:  General: No focal deficit present.     Mental Status: She is alert and oriented to person, place, and time. Mental status is at baseline.     Cranial Nerves: No cranial nerve deficit.     Sensory: No sensory deficit.     Motor: No weakness.     Coordination: Coordination normal.  Psychiatric:        Mood and Affect: Mood normal.     ED Results / Procedures / Treatments   Labs (all labs ordered are listed, but only abnormal results are displayed) Labs Reviewed - No data to display  EKG EKG Interpretation  Date/Time:  Wednesday March 11 2019 20:29:34 EST Ventricular Rate:  61 PR Interval:    QRS Duration: 82 QT Interval:  400 QTC Calculation: 403 R Axis:   51 Text Interpretation: Sinus rhythm Low voltage, precordial leads Probable anteroseptal infarct, old No STEMI Confirmed by Nanda Quinton [phone]) on 03/11/2019 8:32:33 PM   Radiology CT Head Wo  Contrast  Result Date: 03/11/2019 CLINICAL DATA:  Syncope after donating blood EXAM: CT HEAD WITHOUT CONTRAST TECHNIQUE: Contiguous axial images were obtained from the base of the skull through the vertex without intravenous contrast. COMPARISON:  None. FINDINGS: Brain: No acute infarct or hemorrhage. 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 Lateral ventricles and remaining midline structures are unremarkable. There are no acute extra-axial fluid collections. No mass effect. Vascular: No hyperdense vessel or unexpected calcification. Skull: Normal. Negative for fracture or focal lesion. Sinuses/Orbits: No acute finding. Other: Small hematoma in the left parietooccipital scalp. IMPRESSION: 1. Small left parieto-occipital scalp hematoma. 2. No acute intracranial process. Electronically Signed   By: Randa Ngo M.D.   On: 03/11/2019 21:03    Procedures Procedures (including critical care time)  Medications Ordered in ED Medications - No data to display  ED Course  I have reviewed the triage vital signs and the nursing notes.  Pertinent labs & imaging results that were available during my care of the patient were reviewed by me and considered in my medical decision making (see chart for details).    MDM Rules/Calculators/A&P                      Syncope: Patient presented after a syncopal episode after donating blood. Patient was given IV fluids with improvement of her symptoms. Head CT and EKG were WNL. - Patient was discharged with follow up with PCP  Final Clinical Impression(s) / ED Diagnoses Final diagnoses:  Syncope, unspecified syncope type    Rx / DC Orders ED Discharge Orders    None       Marianna Payment, MD 03/16/19 Roney Marion    Marianna Payment, MD 03/16/19 Milly Jakob, MD 03/16/19 1123

## 2019-03-11 NOTE — Discharge Instructions (Signed)
Thank you for letting us participate in your care. Today we treated your for syncope. See instructions below.  Instructions: Please stay hydrated Follow up with your PCP as needed.

## 2019-03-11 NOTE — ED Notes (Signed)
[employer] called to transport pt back to car. PT provided water, Cheree Ditto crackers, and peanut butter. Pt educated to follow up with PCP with headaches and return to ED for sudden N/V blurred vision or dizziness.

## 2019-04-13 ENCOUNTER — Ambulatory Visit: Payer: PRIVATE HEALTH INSURANCE | Attending: Internal Medicine

## 2019-04-13 DIAGNOSIS — Z23 Encounter for immunization: Secondary | ICD-10-CM

## 2019-04-13 NOTE — Progress Notes (Signed)
   Covid-19 Vaccination Clinic  Name:  Janequa Kipnis    MRN: [id number] DOB: 1960/12/15  04/13/2019  Ms. Hults was observed post Covid-19 immunization for 30 minutes based on pre-vaccination screening without incident. She was provided with Vaccine Information Sheet and instruction to access the V-Safe system.   Ms. Anastos was instructed to call 911 with any severe reactions post vaccine: Marland Kitchen Difficulty breathing  . Swelling of face and throat  . A fast heartbeat  . A bad rash all over body  . Dizziness and weakness   Immunizations Administered    Name Date Dose VIS Date Route   Pfizer COVID-19 Vaccine 04/13/2019  4:19 PM 0.3 mL 12/26/2018 Intramuscular   Manufacturer: [employer], [employer]   Lot: [id number]   NDC: [address]-1

## 2019-05-06 ENCOUNTER — Ambulatory Visit: Payer: PRIVATE HEALTH INSURANCE | Attending: Internal Medicine

## 2019-05-06 DIAGNOSIS — Z23 Encounter for immunization: Secondary | ICD-10-CM

## 2019-05-06 NOTE — Progress Notes (Signed)
   Covid-19 Vaccination Clinic  Name:  Reka Wist    MRN: [id number] DOB: May 06, 1960  05/06/2019  Ms. Heier was observed post Covid-19 immunization for 15 minutes without incident. She was provided with Vaccine Information Sheet and instruction to access the V-Safe system.   Ms. Gartland was instructed to call 911 with any severe reactions post vaccine: Marland Kitchen Difficulty breathing  . Swelling of face and throat  . A fast heartbeat  . A bad rash all over body  . Dizziness and weakness   Immunizations Administered    Name Date Dose VIS Date Route   Pfizer COVID-19 Vaccine 05/06/2019  3:16 PM 0.3 mL 03/11/2018 Intramuscular   Manufacturer: [employer], [employer]   Lot: [id number]   NDC: [address]-0

## 2019-09-10 ENCOUNTER — Other Ambulatory Visit: Payer: Self-pay | Admitting: Family Medicine

## 2019-09-10 DIAGNOSIS — Z1231 Encounter for screening mammogram for malignant neoplasm of breast: Secondary | ICD-10-CM

## 2019-10-23 ENCOUNTER — Ambulatory Visit
Admission: RE | Admit: 2019-10-23 | Discharge: 2019-10-23 | Disposition: A | Discharge status: Home / Self Care | Payer: PRIVATE HEALTH INSURANCE | Source: Ambulatory Visit | Attending: Family Medicine | Admitting: Family Medicine

## 2019-10-23 ENCOUNTER — Other Ambulatory Visit: Payer: Self-pay

## 2019-10-23 DIAGNOSIS — Z1231 Encounter for screening mammogram for malignant neoplasm of breast: Secondary | ICD-10-CM

## 2020-04-25 ENCOUNTER — Other Ambulatory Visit (HOSPITAL_COMMUNITY)
Admission: RE | Admit: 2020-04-25 | Discharge: 2020-04-25 | Disposition: A | Discharge status: Home / Self Care | Payer: PRIVATE HEALTH INSURANCE | Source: Ambulatory Visit | Attending: Family Medicine | Admitting: Family Medicine

## 2020-04-25 ENCOUNTER — Other Ambulatory Visit: Payer: Self-pay | Admitting: Family Medicine

## 2020-04-25 DIAGNOSIS — Z01411 Encounter for gynecological examination (general) (routine) with abnormal findings: Secondary | ICD-10-CM | POA: Insufficient documentation

## 2020-04-27 LAB — CYTOLOGY - PAP
Comment: NEGATIVE
Diagnosis: NEGATIVE
High risk HPV: NEGATIVE

## 2020-09-08 ENCOUNTER — Other Ambulatory Visit: Payer: Self-pay | Admitting: Sports Medicine

## 2020-09-08 ENCOUNTER — Ambulatory Visit
Admission: RE | Admit: 2020-09-08 | Discharge: 2020-09-08 | Disposition: A | Discharge status: Home / Self Care | Payer: No Typology Code available for payment source | Source: Ambulatory Visit | Attending: Sports Medicine | Admitting: Sports Medicine

## 2020-09-08 DIAGNOSIS — M25562 Pain in left knee: Secondary | ICD-10-CM

## 2020-10-03 ENCOUNTER — Other Ambulatory Visit: Payer: Self-pay | Admitting: Family Medicine

## 2020-10-03 DIAGNOSIS — Z1231 Encounter for screening mammogram for malignant neoplasm of breast: Secondary | ICD-10-CM

## 2020-11-04 ENCOUNTER — Ambulatory Visit
Admission: RE | Admit: 2020-11-04 | Discharge: 2020-11-04 | Disposition: A | Discharge status: Home / Self Care | Payer: No Typology Code available for payment source | Source: Ambulatory Visit | Attending: Family Medicine | Admitting: Family Medicine

## 2020-11-04 ENCOUNTER — Other Ambulatory Visit: Payer: Self-pay

## 2020-11-04 DIAGNOSIS — Z1231 Encounter for screening mammogram for malignant neoplasm of breast: Secondary | ICD-10-CM

## 2021-02-20 IMAGING — CT CT HEAD W/O CM
3 series · 16 of 47 positions shown, 19 images · non-contrast
Comparison: None.

CLINICAL DATA: Syncope after donating blood

EXAM:
CT HEAD WITHOUT CONTRAST
TECHNIQUE: Contiguous axial images were obtained from the base of the skull
through the vertex without intravenous contrast.

[Series 2: head wo · axial · 0.41mm/px · z∈[+1815,+1940]mm · 10 of 31 slices shown, 13 images]
[im 3/31  brain]
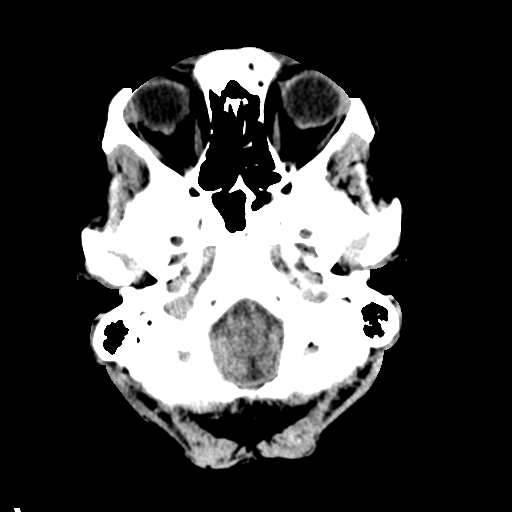
[im 3/31  bone]
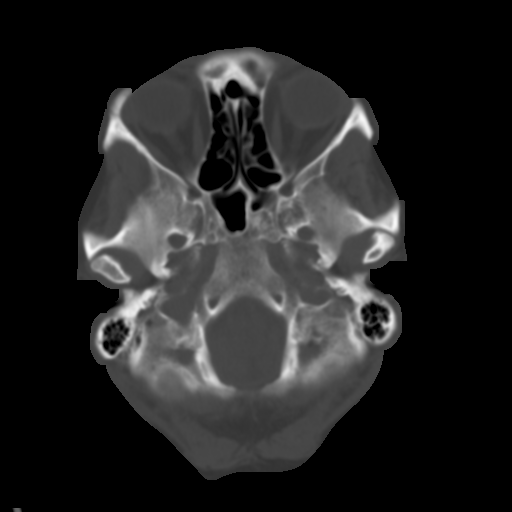
[im 6/31  brain]
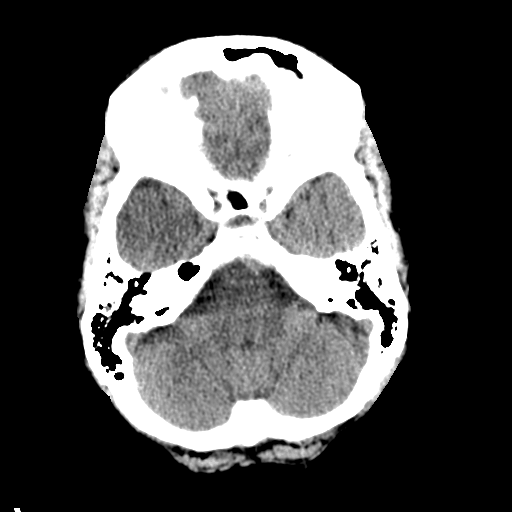
[im 9/31  brain]
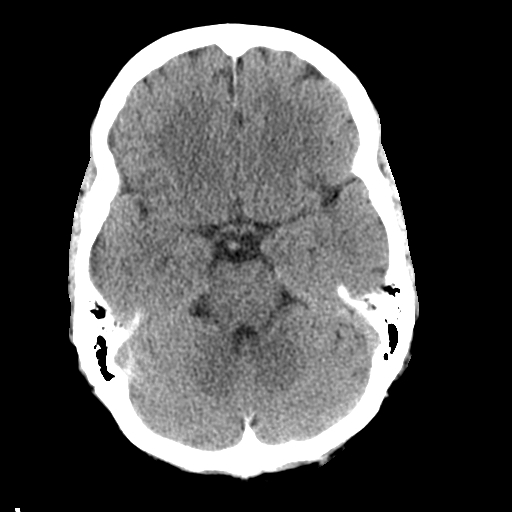
[im 11/31  brain]
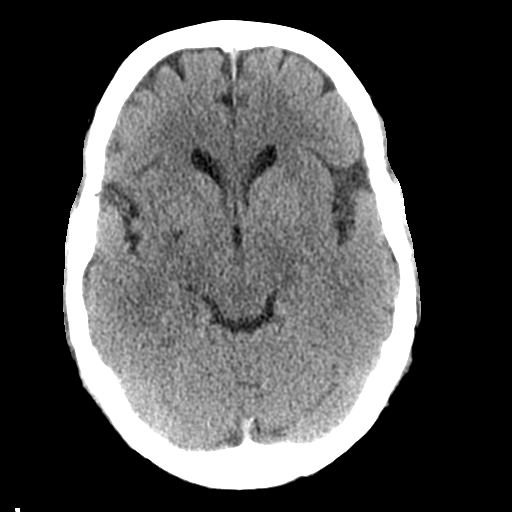
[im 14/31  brain]
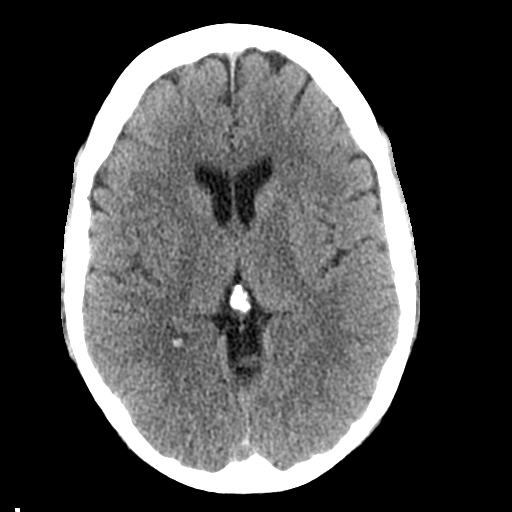
[im 14/31  bone]
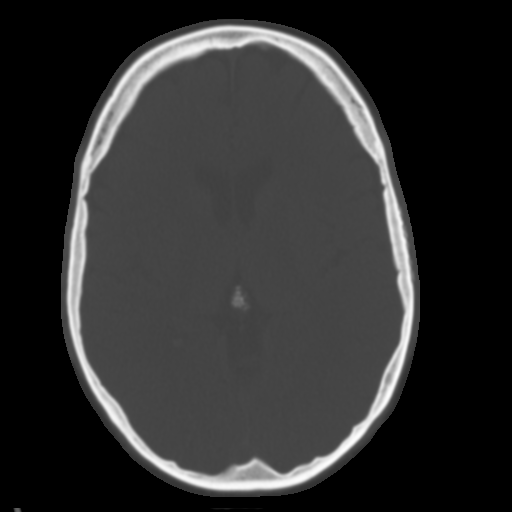
[im 17/31  brain]
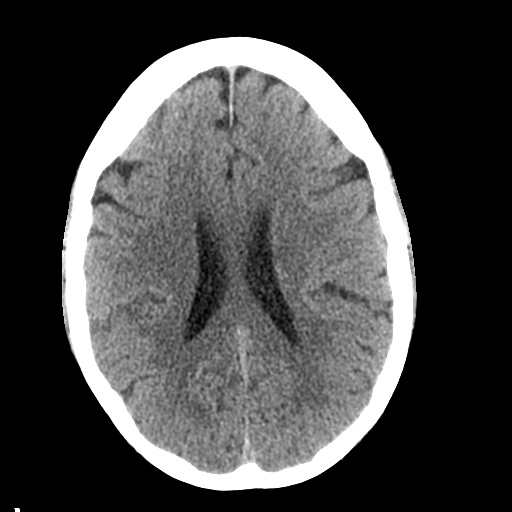
[im 20/31  brain]
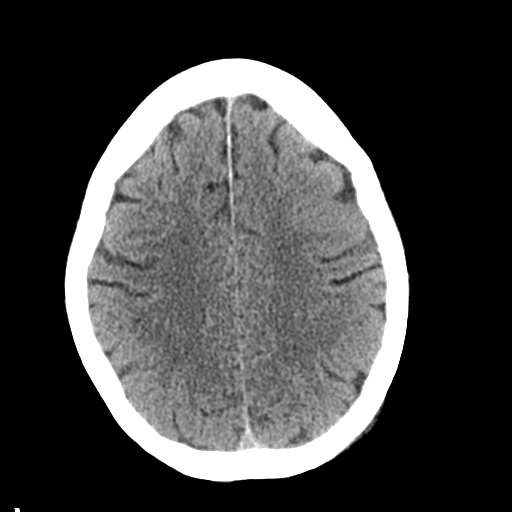
[im 23/31  brain]
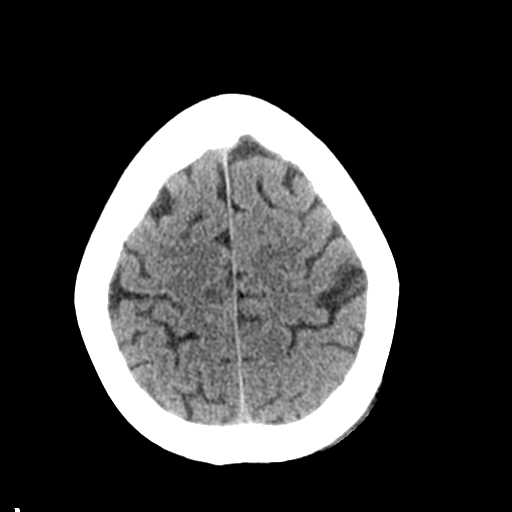
[im 25/31  brain]
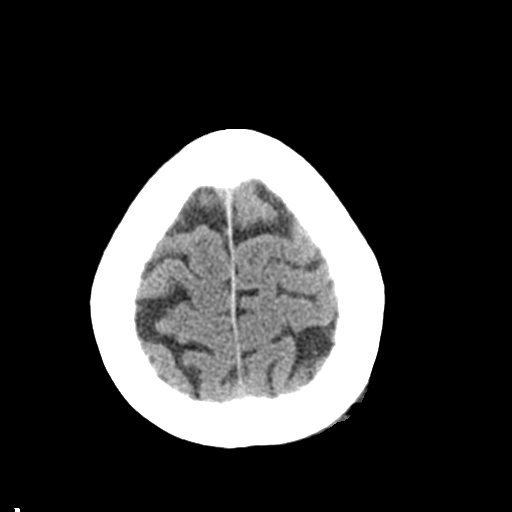
[im 25/31  bone]
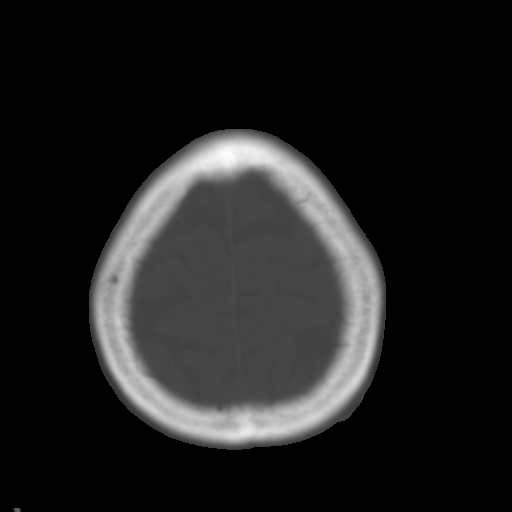
[im 28/31  brain]
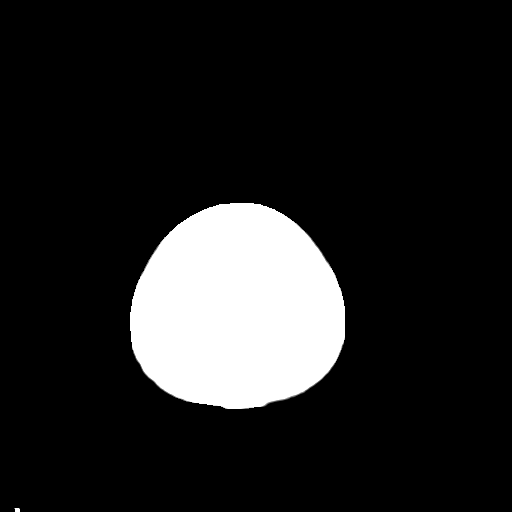

[Series 5: coronal soft tissue · coronal · 0.30mm/px · 3 of 67 slices shown]
[im 23/67  brain]
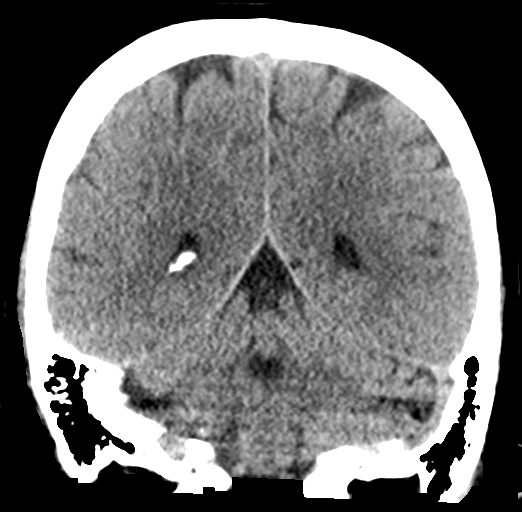
[im 30/67  brain]
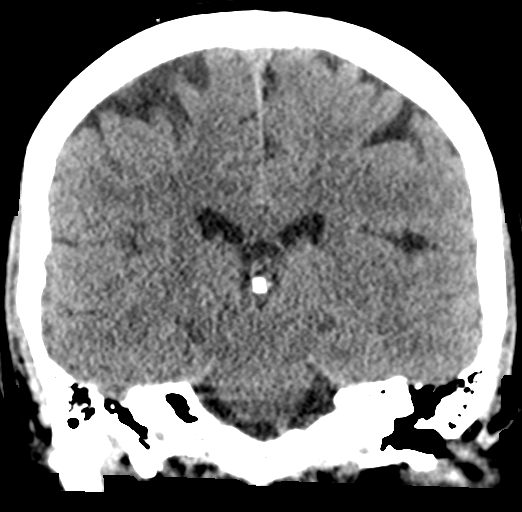
[im 37/67  brain]
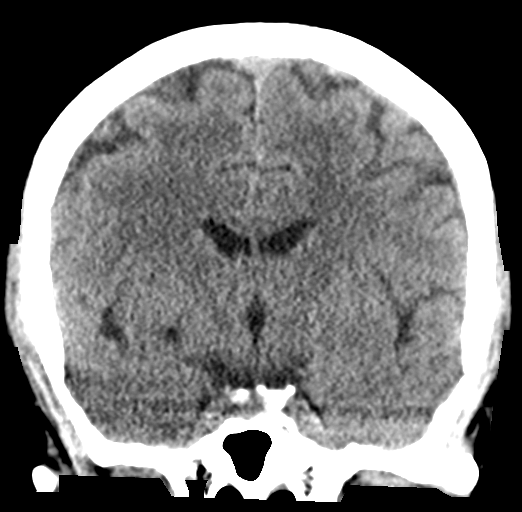

[Series 6: sagittal soft tissue · sagittal · 0.30mm/px · 3 of 52 slices shown]
[im 18/52  brain]
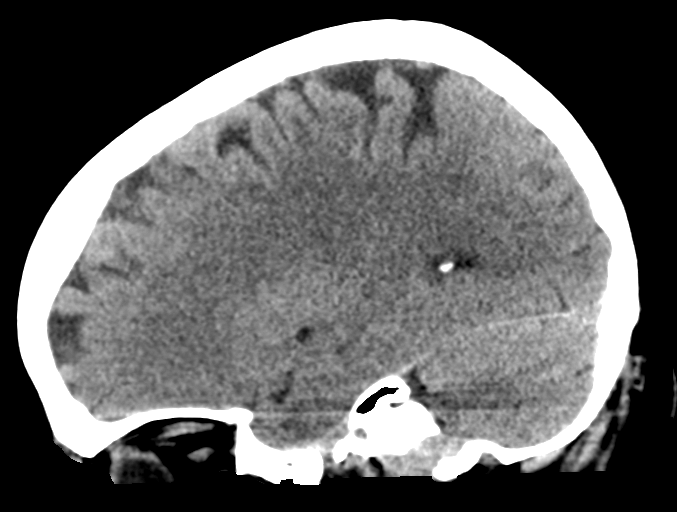
[im 26/52  brain]
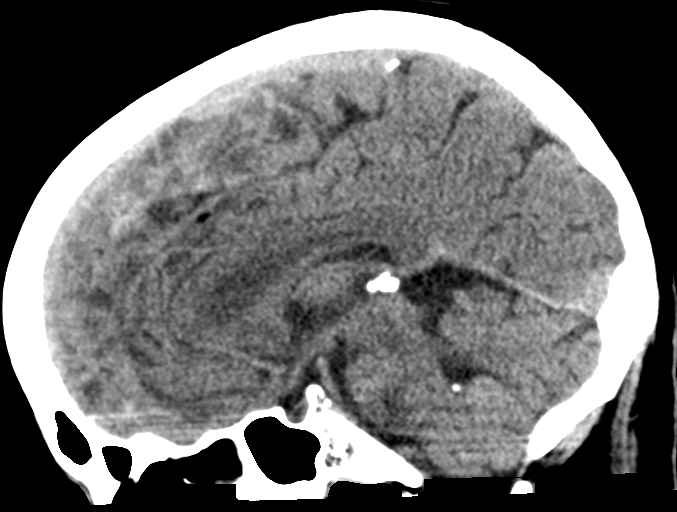
[im 35/52  brain]
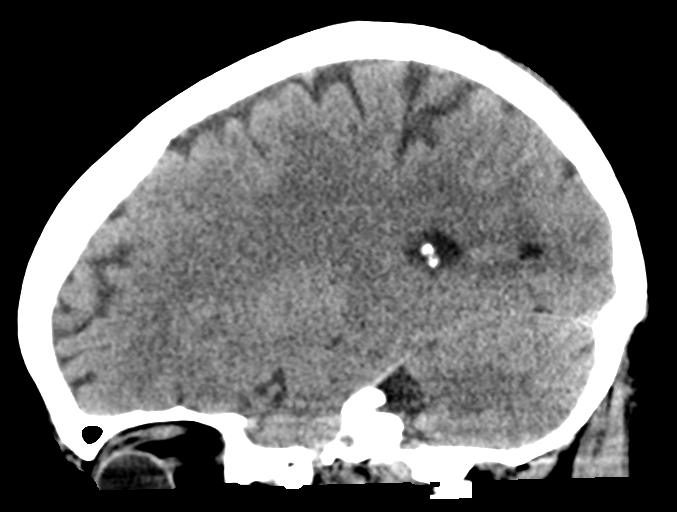

[16 of 47 positions shown; findings below may reference images not displayed]

FINDINGS: Brain: No acute infarct or hemorrhage. Chronic right basal ganglia
lacunar infarct versus dilated perivascular space. Lateral
ventricles and remaining midline structures are unremarkable. There
are no acute extra-axial fluid collections. No mass effect.

Vascular: No hyperdense vessel or unexpected calcification.

Skull: Normal. Negative for fracture or focal lesion.

Sinuses/Orbits: No acute finding.

Other: Small hematoma in the left parietooccipital scalp.
IMPRESSION: 1. Small left parieto-occipital scalp hematoma.
2. No acute intracranial process.

## 2021-09-21 ENCOUNTER — Other Ambulatory Visit: Payer: Self-pay | Admitting: Family Medicine

## 2021-09-21 DIAGNOSIS — Z139 Encounter for screening, unspecified: Secondary | ICD-10-CM

## 2021-10-11 ENCOUNTER — Ambulatory Visit: Payer: PRIVATE HEALTH INSURANCE | Admitting: Internal Medicine

## 2021-11-06 ENCOUNTER — Ambulatory Visit
Admission: RE | Admit: 2021-11-06 | Discharge: 2021-11-06 | Disposition: A | Discharge status: Home / Self Care | Payer: PRIVATE HEALTH INSURANCE | Source: Ambulatory Visit | Attending: Family Medicine | Admitting: Family Medicine

## 2021-11-06 DIAGNOSIS — Z139 Encounter for screening, unspecified: Secondary | ICD-10-CM

## 2021-11-17 ENCOUNTER — Ambulatory Visit: Payer: PRIVATE HEALTH INSURANCE | Admitting: Internal Medicine

## 2021-12-22 ENCOUNTER — Ambulatory Visit (INDEPENDENT_AMBULATORY_CARE_PROVIDER_SITE_OTHER): Payer: PRIVATE HEALTH INSURANCE | Admitting: Internal Medicine

## 2021-12-22 ENCOUNTER — Other Ambulatory Visit: Payer: Self-pay

## 2021-12-22 ENCOUNTER — Encounter: Payer: Self-pay | Admitting: Internal Medicine

## 2021-12-22 VITALS — BP 100/60 | HR 86 | Temp 97.7°F | Resp 16 | Ht 66.25 in | Wt 178.0 lb

## 2021-12-22 DIAGNOSIS — J3089 Other allergic rhinitis: Secondary | ICD-10-CM

## 2021-12-22 DIAGNOSIS — H1013 Acute atopic conjunctivitis, bilateral: Secondary | ICD-10-CM

## 2021-12-22 DIAGNOSIS — L501 Idiopathic urticaria: Secondary | ICD-10-CM | POA: Diagnosis not present

## 2021-12-22 DIAGNOSIS — J302 Other seasonal allergic rhinitis: Secondary | ICD-10-CM

## 2021-12-22 MED ORDER — FEXOFENADINE HCL 180 MG PO TABS: 180.0000 mg | ORAL_TABLET | Freq: Two times a day (BID) | ORAL | 5 refills | Status: AC | PRN

## 2021-12-22 MED ORDER — FAMOTIDINE 20 MG PO TABS: 20.0000 mg | ORAL_TABLET | Freq: Two times a day (BID) | ORAL | 5 refills | Status: AC | PRN

## 2021-12-22 MED ORDER — AZELASTINE HCL 0.1 % NA SOLN: NASAL | 5 refills | Status: AC

## 2021-12-22 NOTE — Progress Notes (Signed)
NEW PATIENT  Date of Service/Encounter:  12/22/21  Consult requested by: Shon Hale, MD   Subjective:   Stacy Zimmerman (DOB: 1960/10/23) is a 61 y.o. female who presents to the clinic on 12/22/2021 with a chief complaint of Urticaria .    History obtained from: chart review and patient.   Hives: Reports onset years ago with red, itchy, raised rashes.  No swelling.  They do not scar and are not painful and individual hive resolves in 24 hours. She reports taking Claritin which helps control it for the most part.  She also has dermatographism.  She has been off the Claritin for over 3 days.  No new medications started at the time. No clear triggers.    In Sept 2023, she was hiking and had multiple tick/chigger bites after which she has blisters followed by scarring.  The scars are improving slowly.  She saw Dermatology and was told they will fade over time.   Rhinitis:  Ongoing for years.  Her symptoms are not very bothersome.  Symptoms include: nasal congestion, rhinorrhea, post nasal drainage, sneezing, watery eyes, and itchy eyes  Occurs year-round Potential triggers: not sure Treatments tried:  Claritin PRN  Saline spray PRN Zyrtec in the past caused upset time.    Previous allergy testing: yes before 2000s; can't recall results History of reflux/heartburn: no History of chronic sinusitis or sinus surgery: no  No celiac testing prior Some GI issues with wheat; diarrhea   Past Medical History: Past Medical History:  Diagnosis Date   Depression    GERD (gastroesophageal reflux disease)    Pneumonia    Past Surgical History: Past Surgical History:  Procedure Laterality Date   MOUTH SURGERY      Family History: Family History  Problem Relation Age of Onset   Breast cancer Other    Breast cancer Sister    Breast cancer Maternal Aunt    Breast cancer Paternal Aunt     Social History:  Lives in a 16 year townhouse Flooring in bedroom:  carpet Pets: cat Tobacco use/exposure: none Job: clinical reviewer   Medication List:  Allergies as of 12/22/2021       Reactions   Hydrocortisone Other (See Comments)   Prednisone Other (See Comments)   Thimerosal (thiomersal)    rash        Medication List        Accurate as of December 22, 2021 11:30 AM. If you have any questions, ask your nurse or doctor.          levalbuterol 45 MCG/ACT inhaler Commonly known as: Xopenex HFA Inhale 1-2 puffs into the lungs every 4 (four) hours as needed for wheezing.   loratadine 10 MG tablet Commonly known as: CLARITIN Take 10 mg by mouth daily.   multivitamin with minerals Tabs tablet Take 1 tablet by mouth daily.   Vitamin D 50 MCG (2000 UT) tablet Take 4,000 Units by mouth daily.         REVIEW OF SYSTEMS: Pertinent positives and negatives discussed in HPI.   Objective:   Physical Exam: BP 100/60   Pulse 86   Temp 97.7 F (36.5 C)   Resp 16   Ht 5' 6.25" (1.683 m)   Wt 178 lb (80.7 kg)   SpO2 100%   BMI 28.51 kg/m  Body mass index is 28.51 kg/m. GEN: alert, well developed HEENT: clear conjunctiva, TM grey and translucent, nose with + inferior turbinate hypertrophy, pink nasal mucosa, slight  clear rhinorrhea, + cobblestoning HEART: regular rate and rhythm, no murmur LUNGS: clear to auscultation bilaterally, no coughing, unlabored respiration ABDOMEN: soft, non distended  SKIN: slight hyperpigmentation on bl legs from prior bites  Reviewed:  Seen by Derm 10/31/2021: seborrheic keratosis, dysplastic nevi, post inflammatory hyperpigmentation. Discussed possible option for steroid cream but it was   Skin Testing:  Skin prick testing was placed, which includes aeroallergens/foods, histamine control, and saline control.  Verbal consent was obtained prior to placing test.  Patient tolerated procedure well.  Allergy testing results were read and interpreted by myself, documented by clinical staff. Adequate  positive and negative control.  Results discussed with patient/family.  Airborne Adult Perc - 12/22/21 0938     Time Antigen Placed 4580    Allergen Manufacturer Waynette Buttery    Location Back    Number of Test 59    Panel 1 Select    1. Control-Buffer 50% Glycerol Negative    2. Control-Histamine 1 mg/ml 3+    3. Albumin saline Negative    4. Bahia Negative    5. French [place] Territ[place] Negative    6. Johnson Negative    7. Kentucky Blue Negative    8. Meadow Fescue Negative    9. Perennial Rye Negative    10. Sweet Vernal Negative    11. Timothy Negative    12. Cocklebur Negative    13. Burweed Marshelder Negative    14. Ragweed, short Negative    15. Ragweed, Giant Negative    16. Plantain,  English Negative    17. Lamb's Quarters Negative    18. Sheep Sorrell Negative    19. Rough Pigweed Negative    20. Marsh Elder, Rough Negative    21. Mugwort, Common Negative    22. Ash mix Negative    23. Birch mix Negative    24. Beech American Negative    25. Box, Elder Negative    26. Cedar, red Negative    27. Cottonwood, Guinea-Bissau Negative    28. Elm mix Negative    29. Hickory Negative    30. Maple mix Negative    31. Oak, Guinea[place] mix Negative    32. Pecan Pollen 3+    33. Pine mix Negative    34. Sycamore Eastern Negative    35. Walnut, Black Pollen Negative    36. Alternaria alternata Negative    37. Cladosporium Herbarum Negative    38. Aspergillus mix Negative    39. Penicillium mix Negative    40. Bipolaris sorokiniana (Helminthosporium) Negative    41. Drechslera spicifera (Curvularia) Negative    42. Mucor plumbeus Negative    43. Fusarium moniliforme Negative    44. Aureobasidium pullulans (pullulara) Negative    45. Rhizopus oryzae Negative    46. Botrytis cinera Negative    47. Epicoccum nigrum Negative    48. Phoma betae Negative    49. Candida Albicans Negative    50. Trichophyton mentagrophytes Negative    51. Mite, D Farinae  5,000 AU/ml Negative    52. Mite, D  Pteronyssinus  5,000 AU/ml Negative    53. Cat Hair 10,000 BAU/ml Negative    54.  Dog Epithelia Negative    55. Mixed Feathers Negative    56. Horse Epithelia Negative    57. Cockroach, German Negative    58. Mouse Negative    59. Tobacco Leaf Negative             Intradermal - 12/22/21 1105  Time Antigen Placed 1105    Allergen Manufacturer Greer    Location Arm    Number of Test 14    Intradermal Select    Control Negative    French [place] Territ[place] Negative    Johnson Negative    7 Grass Negative    Ragweed mix Negative    Weed mix Negative    Mold 1 Negative    Mold 2 Negative    Mold 3 3+    Mold 4 2+    Cat Negative    Dog 3+    Cockroach Negative    Mite mix Negative               Assessment:   1. Seasonal and perennial allergic rhinitis   2. Allergic conjunctivitis of both eyes   3. Idiopathic urticaria     Plan/Recommendations:  Idiopathic Urticaria (Hives): - Reaction to insect bites resulting in blisters/hives is common and can leave some scarring after that should heal over time.  If you start have scarring with your typical hives, then please let us know.  -Start Allegra 180mg  daily. Okay to stay on Claritin 10mg  daily also if you are controlled but if symptoms become uncontrolled then switch to Allegra.    -If no improvement in 2-3 days, increase to Allegra 180mg  twice daily.   -If no improvement in 2-3 days, add Pepcid 20mg  twice daily and continue Allegra 180mg  twice daily. -If still no improvement, increase to Allegra 360mg  twice daily and Pepcid 40mg  twice daily.   Allergic Rhinitis Allergic Conjunctivitis  - Positive skin test 12/2021: trees, mold, dog  - Avoidance measures discussed. - Use nasal saline rinses before nose sprays such as with Neilmed Sinus Rinse.  Use distilled water.   - Use Azelastine 1-2 sprays each nostril twice daily as needed. Aim upward and outward. - Use Allegra 180mg  daily as needed for runny nose, drainage, itchy  watery eyes, sneezing.  - Consider allergy shots as long term control of your symptoms by teaching your immune system to be more tolerant of your allergy triggers   Return in about 3 months (around 03/23/2022).  , MD [facility]

## 2021-12-22 NOTE — Patient Instructions (Addendum)
Idiopathic Urticaria (Hives): - Reaction to insect bites resulting in blisters/hives is common and can leave some scarring after that should heal over time.  If you start have scarring with your typical hives, then please let us know.  -Start Allegra 180mg  daily. Okay to stay on Claritin 10mg  daily also if you are controlled but if symptoms become uncontrolled then switch to Allegra.    -If no improvement in 2-3 days, increase to Allegra 180mg  twice daily.   -If no improvement in 2-3 days, add Pepcid 20mg  twice daily and continue Allegra 180mg  twice daily. -If still no improvement, increase to Allegra 360mg  twice daily and Pepcid 40mg  twice daily.   Allergic Rhinitis Allergic Conjunctivitis  - Positive skin test 12/2021: trees, mold, dog  - Avoidance measures discussed. - Use nasal saline rinses before nose sprays such as with Neilmed Sinus Rinse.  Use distilled water.   - Use Azelastine 1-2 sprays each nostril twice daily as needed. Aim upward and outward. - Use Allegra 180mg  daily as needed for runny nose, drainage, itchy watery eyes, sneezing.  - Consider allergy shots as long term control of your symptoms by teaching your immune system to be more tolerant of your allergy triggers   ALLERGEN AVOIDANCE MEASURES Molds - Indoor avoidance Use air conditioning to reduce indoor humidity.  Do not use a humidifier. Keep indoor humidity at 30 - 40%.  Use a dehumidifier if needed. In the bathroom use an exhaust fan or open a window after showering.  Wipe down damp surfaces after showering.  Clean bathrooms with a mold-killing solution (diluted bleach, or products like Tilex, etc) at least once a month. In the kitchen use an exhaust fan to remove steam from cooking.  Throw away spoiled foods immediately, and empty garbage daily.  Empty water pans below self-defrosting refrigerators frequently. Vent the clothes dryer to the outside. Limit indoor houseplants; mold grows in the dirt.  No houseplants in  the bedroom. Remove carpet from the bedroom. Encase the mattress and box springs with a zippered encasing.  Molds - Outdoor avoidance Avoid being outside when the grass is being mowed, or the ground is tilled. Avoid playing in leaves, pine straw, hay, etc.  Dead plant materials contain mold. Avoid going into barns or grain storage areas. Remove leaves, clippings and compost from around the home.  Pollen Avoidance Pollen levels are highest during the mid-day and afternoon.  Consider this when planning outdoor activities. Avoid being outside when the grass is being mowed, or wear a mask if the pollen-allergic person must be the one to mow the grass. Keep the windows closed to keep pollen outside of the home. Use an air conditioner to filter the air. Take a shower, wash hair, and change clothing after working or playing outdoors during pollen season. Pet Dander Keep the pet out of your bedroom and restrict it to only a few rooms. Be advised that keeping the pet in only one room will not limit the allergens to that room. Don't pet, hug or kiss the pet; if you do, wash your hands with soap and water. High-efficiency particulate air (HEPA) cleaners run continuously in a bedroom or living room can reduce allergen levels over time. Regular use of a high-efficiency vacuum cleaner or a central vacuum can reduce allergen levels. Giving your pet a bath at least once a week can reduce airborne allergen.

## 2022-03-23 ENCOUNTER — Ambulatory Visit: Payer: PRIVATE HEALTH INSURANCE | Admitting: Internal Medicine

## 2022-10-31 ENCOUNTER — Other Ambulatory Visit: Payer: Self-pay | Admitting: Family Medicine

## 2022-10-31 DIAGNOSIS — Z1231 Encounter for screening mammogram for malignant neoplasm of breast: Secondary | ICD-10-CM

## 2022-11-21 ENCOUNTER — Ambulatory Visit: Payer: PRIVATE HEALTH INSURANCE

## 2022-11-29 ENCOUNTER — Ambulatory Visit: Payer: PRIVATE HEALTH INSURANCE

## 2022-12-25 ENCOUNTER — Ambulatory Visit
Admission: RE | Admit: 2022-12-25 | Discharge: 2022-12-25 | Disposition: A | Discharge status: Home / Self Care | Payer: BC Managed Care – PPO | Source: Ambulatory Visit | Attending: Family Medicine

## 2022-12-25 DIAGNOSIS — Z1231 Encounter for screening mammogram for malignant neoplasm of breast: Secondary | ICD-10-CM

## 2023-12-06 ENCOUNTER — Other Ambulatory Visit: Payer: Self-pay | Admitting: Family Medicine

## 2023-12-06 DIAGNOSIS — Z1231 Encounter for screening mammogram for malignant neoplasm of breast: Secondary | ICD-10-CM

## 2024-01-14 ENCOUNTER — Ambulatory Visit
Admission: RE | Admit: 2024-01-14 | Discharge: 2024-01-14 | Disposition: A | Discharge status: Home / Self Care | Source: Ambulatory Visit | Attending: Family Medicine | Admitting: Family Medicine

## 2024-01-14 DIAGNOSIS — Z1231 Encounter for screening mammogram for malignant neoplasm of breast: Secondary | ICD-10-CM

## 2024-01-17 ENCOUNTER — Other Ambulatory Visit: Payer: Self-pay | Admitting: Family Medicine

## 2024-01-17 DIAGNOSIS — R928 Other abnormal and inconclusive findings on diagnostic imaging of breast: Secondary | ICD-10-CM

## 2024-01-30 ENCOUNTER — Ambulatory Visit
Admission: RE | Admit: 2024-01-30 | Discharge: 2024-01-30 | Disposition: A | Source: Ambulatory Visit | Attending: Family Medicine

## 2024-01-30 DIAGNOSIS — R928 Other abnormal and inconclusive findings on diagnostic imaging of breast: Secondary | ICD-10-CM
